# Patient Record
Sex: Male | Born: 1960 | Hispanic: No | Marital: Single | Smoking: Former smoker
Health system: Southern US, Community
[De-identification: ages and names within clinical notes are randomized; demographics above are authoritative.]

## PROBLEM LIST (undated history)

## (undated) DIAGNOSIS — I10 Essential (primary) hypertension: Secondary | ICD-10-CM

## (undated) DIAGNOSIS — E785 Hyperlipidemia, unspecified: Secondary | ICD-10-CM

## (undated) DIAGNOSIS — H348112 Central retinal vein occlusion, right eye, stable: Secondary | ICD-10-CM

## (undated) DIAGNOSIS — N529 Male erectile dysfunction, unspecified: Secondary | ICD-10-CM

## (undated) HISTORY — DX: Essential (primary) hypertension: I10

## (undated) HISTORY — DX: Central retinal vein occlusion, right eye, stable: H34.8112

## (undated) HISTORY — DX: Hyperlipidemia, unspecified: E78.5

## (undated) HISTORY — PX: TONSILLECTOMY: SHX5217

## (undated) HISTORY — DX: Male erectile dysfunction, unspecified: N52.9

---

## 2001-04-22 ENCOUNTER — Encounter: Admission: RE | Admit: 2001-04-22 | Discharge: 2001-04-22 | Payer: Self-pay | Admitting: Family Medicine

## 2001-04-22 ENCOUNTER — Encounter: Payer: Self-pay | Admitting: Family Medicine

## 2012-03-12 ENCOUNTER — Encounter: Payer: Self-pay | Admitting: Family Medicine

## 2012-03-12 ENCOUNTER — Ambulatory Visit (INDEPENDENT_AMBULATORY_CARE_PROVIDER_SITE_OTHER): Payer: 59 | Admitting: Family Medicine

## 2012-03-12 VITALS — BP 128/98 | HR 92 | Temp 98.2°F | Resp 16 | Ht 65.25 in | Wt 205.0 lb

## 2012-03-12 DIAGNOSIS — R0789 Other chest pain: Secondary | ICD-10-CM

## 2012-03-12 DIAGNOSIS — R5383 Other fatigue: Secondary | ICD-10-CM

## 2012-03-12 DIAGNOSIS — E669 Obesity, unspecified: Secondary | ICD-10-CM

## 2012-03-12 DIAGNOSIS — N400 Enlarged prostate without lower urinary tract symptoms: Secondary | ICD-10-CM

## 2012-03-12 DIAGNOSIS — Z0000 Encounter for general adult medical examination without abnormal findings: Secondary | ICD-10-CM

## 2012-03-12 DIAGNOSIS — Z13220 Encounter for screening for lipoid disorders: Secondary | ICD-10-CM

## 2012-03-12 DIAGNOSIS — R072 Precordial pain: Secondary | ICD-10-CM

## 2012-03-12 DIAGNOSIS — Z23 Encounter for immunization: Secondary | ICD-10-CM

## 2012-03-12 LAB — CBC WITH DIFFERENTIAL/PLATELET
Basophils Absolute: 0 10*3/uL (ref 0.0–0.1)
Basophils Relative: 0 % (ref 0–1)
Eosinophils Absolute: 0.2 10*3/uL (ref 0.0–0.7)
Eosinophils Relative: 2 % (ref 0–5)
HCT: 42.2 % (ref 39.0–52.0)
Hemoglobin: 14.9 g/dL (ref 13.0–17.0)
Lymphocytes Relative: 21 % (ref 12–46)
Lymphs Abs: 1.6 10*3/uL (ref 0.7–4.0)
MCH: 29.6 pg (ref 26.0–34.0)
MCHC: 35.3 g/dL (ref 30.0–36.0)
MCV: 83.9 fL (ref 78.0–100.0)
Monocytes Absolute: 0.6 10*3/uL (ref 0.1–1.0)
Monocytes Relative: 8 % (ref 3–12)
Neutro Abs: 4.9 10*3/uL (ref 1.7–7.7)
Neutrophils Relative %: 69 % (ref 43–77)
Platelets: 328 10*3/uL (ref 150–400)
RBC: 5.03 MIL/uL (ref 4.22–5.81)
RDW: 13.6 % (ref 11.5–15.5)
WBC: 7.2 10*3/uL (ref 4.0–10.5)

## 2012-03-12 LAB — COMPREHENSIVE METABOLIC PANEL
ALT: 24 U/L (ref 0–53)
AST: 21 U/L (ref 0–37)
Albumin: 5 g/dL (ref 3.5–5.2)
Alkaline Phosphatase: 63 U/L (ref 39–117)
BUN: 13 mg/dL (ref 6–23)
CO2: 26 mEq/L (ref 19–32)
Calcium: 10 mg/dL (ref 8.4–10.5)
Chloride: 101 mEq/L (ref 96–112)
Creat: 1.05 mg/dL (ref 0.50–1.35)
Glucose, Bld: 95 mg/dL (ref 70–99)
Potassium: 4.3 mEq/L (ref 3.5–5.3)
Sodium: 136 mEq/L (ref 135–145)
Total Bilirubin: 0.4 mg/dL (ref 0.3–1.2)
Total Protein: 7.5 g/dL (ref 6.0–8.3)

## 2012-03-12 LAB — LIPID PANEL
Cholesterol: 278 mg/dL — ABNORMAL HIGH (ref 0–200)
HDL: 41 mg/dL (ref >39)
LDL Cholesterol: 221 mg/dL — ABNORMAL HIGH (ref 0–99)
Total CHOL/HDL Ratio: 6.8 Ratio
Triglycerides: 78 mg/dL (ref <150)
VLDL: 16 mg/dL (ref 0–40)

## 2012-03-12 LAB — POCT URINALYSIS DIPSTICK
Bilirubin, UA: NEGATIVE
Glucose, UA: NEGATIVE
Ketones, UA: NEGATIVE
Leukocytes, UA: NEGATIVE
Nitrite, UA: NEGATIVE
Protein, UA: NEGATIVE
Spec Grav, UA: 1.01
Urobilinogen, UA: 0.2
pH, UA: 7

## 2012-03-12 LAB — IFOBT (OCCULT BLOOD): IFOBT: NEGATIVE

## 2012-03-12 NOTE — Patient Instructions (Addendum)
Keeping you healthy  Get these tests  Blood pressure- Have your blood pressure checked once a year by your healthcare provider.  Normal blood pressure is 120/80  Weight- Have your body mass index (BMI) calculated to screen for obesity.  BMI is a measure of body fat based on height and weight. You can also calculate your own BMI at [email or web address].  Cholesterol- Have your cholesterol checked every year.  Diabetes- Have your blood sugar checked regularly if you have high blood pressure, high cholesterol, have a family history of diabetes or if you are overweight.  Screening for Colon Cancer- Colonoscopy starting at age 34.  Screening may begin sooner depending on your family history and other health conditions. Follow up colonoscopy as directed by your Gastroenterologist.  Screening for Prostate Cancer- Both blood work (PSA) and a rectal exam help screen for Prostate Cancer.  Screening begins at age 35 with African-American men and at age 50 with Caucasian men.  Screening may begin sooner depending on your family history.  Take these medicines  Aspirin- One aspirin daily can help prevent Heart disease and Stroke.  Flu shot- Every fall.  Tetanus- Every 10 years. Tdap was given today. Next TD due in 10 years.  Zostavax- Once after the age of 36 to prevent Shingles. We are recommending that you get this vaccine after age 16.  Pneumonia shot- Once after the age of 68; if you are younger than 87, ask your healthcare provider if you need a Pneumonia shot.  Take these steps  Don't smoke- If you do smoke, talk to your doctor about quitting.  For tips on how to quit, go to www.smokefree.gov or call 1-800-QUIT-NOW.  Be physically active- Exercise 5 days a week for at least 30 minutes.  If you are not already physically active start slow and gradually work up to 30 minutes of moderate physical activity.  Examples of moderate activity include walking briskly, mowing the yard, dancing,  swimming, bicycling, etc.  Eat a healthy diet- Eat a variety of healthy food such as fruits, vegetables, low fat milk, low fat cheese, yogurt, lean meant, poultry, fish, beans, tofu, etc. For more information go to www.thenutritionsource.org  Drink alcohol in moderation- Limit alcohol intake to less than two drinks a day. Never drink and drive.  Dentist- Brush and floss twice daily; visit your dentist twice a year.  Depression- Your emotional health is as important as your physical health. If you're feeling down, or losing interest in things you would normally enjoy please talk to your healthcare provider.  Eye exam- Visit your eye doctor every year.  Safe sex- If you may be exposed to a sexually transmitted infection, use a condom.  Seat belts- Seat belts can save your life; always wear one.  Smoke/Carbon Monoxide detectors- These detectors need to be installed on the appropriate level of your home.  Replace batteries at least once a year.  Skin cancer- When out in the sun, cover up and use sunscreen 15 SPF or higher.  Violence- If anyone is threatening you, please tell your healthcare provider.  Living Will/ Health care power of attorney- Speak with your healthcare provider and family.     Benign Prostatic Hypertrophy  The prostate gland is part of the reproductive system of men. A normal prostate is about the size and shape of a walnut. The prostate gland makes a fluid that is mixed with sperm to make semen. This gland surrounds the urethra and is located in front of the rectum  and just below the bladder. The bladder is where urine is stored. The urethra is the tube through which urine passes from the bladder to get out of the body. The prostate grows as a man ages. An enlarged prostate not caused by cancer is called benign prostatic hypertrophy (BPH). This is a common health problem in men over age 50. This condition is a normal part of aging. An enlarged prostate presses on the  urethra. This makes it harder to pass urine. In the early stages of enlargement, the bladder can get by with a narrowed urethra by forcing the urine through. If the problem gets worse, medical or surgical treatment may be required.  This condition should be followed by your caregiver. Longstanding back pressure on the kidneys can cause infection. Back pressure and infection can progress to bladder damage and kidney (renal) failure. If needed, your caregiver may refer you to a specialist in kidney and prostate disease (urologist). CAUSES  The exact cause is not known.  SYMPTOMS   You are not able to completely empty your bladder.   Getting up often during the night to urinate.   Need to urinate frequently during the day.   Difficultly in starting urine flow.   Decrease in size and strength of the urine stream.   Dribbling after urination.   Pain on urination (more common with infection).   Inability to pass your water. This needs immediate treatment.  DIAGNOSIS  These tests will help your caregiver understand your problem:  Digital rectal exam (DRE). In a rectal exam, your caregiver checks your prostate by putting a gloved, lubricated finger into the rectum to feel the back of your prostate gland. This exam detects the size of the gland and abnormal lumps or growths.   Urinalysis (exam of the urine). This may include a culture if there is concern about infection.   Prostate Specific Antigen (PSA). This is a blood test used to screen for prostate cancer. It is not used alone for diagnosing prostate cancer.   Rectal ultrasound (sonogram). This test uses sound waves to electronically produce a "picture" of the prostate. It helps examine the prostate gland for cancer.  TREATMENT  Mild symptoms may not need treatment. Simple observation and yearly exams may be all that is required. Medications and surgery are options for more severe problems. Your caregiver can help you make an informed  decision for what is best. Two classes of medications are available for relief of prostate symptoms:  Medications that shrink the prostate. This helps relieve symptoms.   Uncommon side effects include problems with sexual function.   Medications to relax the muscle of the prostate. This also relieves the obstruction.   Side effects can include dizziness, fatigue, lightheadedness, and retrograde ejaculation (diminished volume of ejaculate).  Several types of surgical treatments are available for relief of prostate symptoms:  Transurethral resection of the prostate (TURP). In this treatment, an instrument is inserted through opening at the tip of the penis. It is used to cut away pieces of the inner core of the prostate. The pieces are removed through the same opening of the penis. This removes the obstruction and helps get rid of the symptoms.   Transurethral incision (TUIP). In this procedure, small cuts are made in the prostate. This lessens the prostates pressure on the urethra.   Transurethral microwave thermotherapy (TUMT). This procedure uses microwaves to create heat. The heat destroys and removes a small amount of prostate tissue.   Transurethral needle ablation (  TUNA). This is a procedure that uses radio frequencies to do the same as TUMT.   Interstitial laser coagulation (ILC). This is a procedure that uses a laser to do the same as TUMT and TUNA.   Transurethral electrovaporization (TUVP). This is a procedure that uses electrodes to do the same as the procedures listed above.  Regardless of the method of treatment chosen, you and your caregiver will discuss the options. With this knowledge, you along with your caregiver can decide upon the best treatment for you. SEEK MEDICAL CARE IF:   You develop chills, fever of 100.5 F (38.1 C), or night sweats.   There is unexplained back pain.   Symptoms are not helped by medications prescribed.   You develop medication side effects.    Your urine becomes very dark or has a bad smell.  SEEK IMMEDIATE MEDICAL CARE IF:   You are suddenly unable to urinate. This is an emergency. You should be seen immediately.   There are large amounts of blood or clots in the urine.   Your urinary problems become unmanageable.   You develop lightheadedness, severe dizziness, or you feel faint.   You develop moderate to severe low back or flank pain.   You develop chills or fever.  Document Released: 08/04/2005 Document Revised: 07/24/2011 Document Reviewed: 04/26/2007 [facility] Patient Information 2012 [place], [place].  Today, your prostate felt enlarged and there may have been a palpable nodule on the right lobe. I may be referring you to a Urologist for further evaluation, especially if your PSA (prostate blood test) is elevated.

## 2012-03-12 NOTE — Progress Notes (Signed)
Subjective:    Patient ID: Jorge Molina, male    DOB: [birth year], 51 y.o.   MRN: [id number]  HPI This 51 y.o. Cauc male is here for CPE (no exam in ~5 years). Acknowledges obesity and weight  loss goal is 190 lbs by next birthday. He is advised about CRS starting at age 65; requests  referral to GI in [place], [place].   Pt is a single male who is employed; he quit smoking ~ 10 years ago, consumes 1-2 alcoholic  drinks daily. Does not engage in daily exercise; he does plan to lose weight and his goal is 190 lbs by  08/18/2010 (his birthday).  He is sexually active and has had 3 partners in last 12 months (uses condoms).    Review of Systems  Constitutional: Positive for fatigue. Negative for fever, diaphoresis, activity change, appetite change and unexpected weight change.  HENT: Negative.   Eyes:       Wears "readers"  Respiratory: Positive for chest tightness. Negative for cough, choking and shortness of breath.        Has symptoms when at work and having a stressful day; No radiation, SOB or diaphoresis  Cardiovascular: Negative.   Gastrointestinal: Positive for blood in stool. Negative for abdominal pain, diarrhea, constipation and rectal pain.       Chronic infrequent occurrence; no pain or hx of hemorrhoids  Genitourinary: Positive for frequency. Negative for dysuria, urgency, hematuria and difficulty urinating.       Nocturia 1-2x  Musculoskeletal: Negative.   Skin: Negative.   Neurological: Negative.   Hematological: Negative.   Psychiatric/Behavioral: Negative.        Objective:   Physical Exam  Nursing note and vitals reviewed. Constitutional: He is oriented to person, place, and time. He appears well-developed and well-nourished. No distress.  HENT:  Head: Normocephalic and atraumatic.  Right Ear: Hearing, tympanic membrane, external ear and ear canal normal.  Left Ear: Hearing, tympanic membrane, external ear and ear canal normal.  Nose: Nose normal. No mucosal  edema, nasal deformity or septal deviation.  Mouth/Throat: Uvula is midline, oropharynx is clear and moist and mucous membranes are normal. No oral lesions. Normal dentition. No posterior oropharyngeal erythema.  Eyes: Conjunctivae and EOM are normal. Pupils are equal, round, and reactive to light. No scleral icterus.  Neck: Normal range of motion. Neck supple. No thyromegaly present.  Cardiovascular: Normal rate, regular rhythm, normal heart sounds and intact distal pulses.  Exam reveals no gallop and no friction rub.   No murmur heard. Pulmonary/Chest: Effort normal and breath sounds normal. No respiratory distress. He has no wheezes. He has no rales.  Abdominal: Soft. Normal aorta and bowel sounds are normal. He exhibits no distension, no pulsatile midline mass and no mass. There is no hepatosplenomegaly. There is no tenderness. There is no guarding and no CVA tenderness. No hernia.  Genitourinary: Rectum normal, testes normal and penis normal. Rectal exam shows no external hemorrhoid, no fissure, no mass, no tenderness and anal tone normal. Guaiac negative stool. Prostate is enlarged. Prostate is not tender. Right testis shows no mass, no swelling and no tenderness. Right testis is descended. Left testis shows no mass, no swelling and no tenderness. Left testis is descended.  Musculoskeletal: Normal range of motion. He exhibits no edema and no tenderness.  Lymphadenopathy:    He has no cervical adenopathy.       Right: No inguinal adenopathy present.       Left: No inguinal adenopathy present.  Neurological: He is alert and oriented to person, place, and time. He has normal reflexes. No cranial nerve deficit. He exhibits normal muscle tone. Coordination normal.  Skin: Skin is warm and dry. No rash noted. No erythema.  Psychiatric: He has a normal mood and affect. His behavior is normal. Judgment and thought content normal.     ECG: NSR; no ectopy or ST-TW changes     Assessment & Plan:    1. Routine general medical examination at a health care facility  PSA, IFOBT POC (occult bld, rslt in office), POCT urinalysis dipstick  2. Fatigue  Comprehensive metabolic panel, Vitamin D, 25-hydroxy, CBC with Differential, Testosterone  3. Chest pain, mid sternal  EKG 12-Lead  4. BPH (benign prostatic hypertrophy)    5. Obesity (BMI 35.0-39.9 without comorbidity)  Lipid panel, Vitamin D, 25-hydroxy  6. Screening for hyperlipidemia  Lipid panel  7. Need for prophylactic vaccination with combined diphtheria-tetanus-pertussis (DTP) vaccine  Tdap vaccine greater than or equal to 7yo IM

## 2012-03-13 LAB — VITAMIN D 25 HYDROXY (VIT D DEFICIENCY, FRACTURES): Vit D, 25-Hydroxy: 50 ng/mL (ref 30–89)

## 2012-03-13 LAB — PSA: PSA: 0.68 ng/mL (ref <=4.00)

## 2012-03-16 DIAGNOSIS — E669 Obesity, unspecified: Secondary | ICD-10-CM

## 2012-03-16 DIAGNOSIS — R5383 Other fatigue: Secondary | ICD-10-CM | POA: Insufficient documentation

## 2012-03-16 DIAGNOSIS — N400 Enlarged prostate without lower urinary tract symptoms: Secondary | ICD-10-CM

## 2012-03-16 HISTORY — DX: Benign prostatic hyperplasia without lower urinary tract symptoms: N40.0

## 2012-03-16 HISTORY — DX: Other fatigue: R53.83

## 2012-03-16 HISTORY — DX: Obesity, unspecified: E66.9

## 2012-03-16 NOTE — Progress Notes (Signed)
Quick Note:  Please call pt and advise that the following labs are abnormal... All your labs are normal except Total cholesterol and LDL ("bad") cholesterol. I would like for you to get OTC Fish Oil supplement ( like Schiff MegaRed Omega-3 Krill Oil) and take 1 gelcap every day. Also, try to improve nutrition and avoid [place] foods, junk foods, processed foods; eat more fruits and vegetables and get regular exercise. You have set a weight goal of 190-195 lbs by your next birthday. It would be a good idea to recheck Lipids about that time to see if weight loss has made a difference. The high LDL suggests that this is inherited; if so, you may have to go on medication but we ill address that after the next lab visit. Make an appt to come back in January 2014.  Copy to pt. ______

## 2013-03-18 DIAGNOSIS — H348112 Central retinal vein occlusion, right eye, stable: Secondary | ICD-10-CM

## 2013-03-18 HISTORY — DX: Central retinal vein occlusion, right eye, stable: H34.8112

## 2013-03-30 ENCOUNTER — Encounter (INDEPENDENT_AMBULATORY_CARE_PROVIDER_SITE_OTHER): Payer: 59 | Admitting: Ophthalmology

## 2013-03-30 DIAGNOSIS — H3530 Unspecified macular degeneration: Secondary | ICD-10-CM

## 2013-03-30 DIAGNOSIS — H35039 Hypertensive retinopathy, unspecified eye: Secondary | ICD-10-CM

## 2013-03-30 DIAGNOSIS — H348192 Central retinal vein occlusion, unspecified eye, stable: Secondary | ICD-10-CM

## 2013-03-30 DIAGNOSIS — I10 Essential (primary) hypertension: Secondary | ICD-10-CM

## 2013-03-30 DIAGNOSIS — H43819 Vitreous degeneration, unspecified eye: Secondary | ICD-10-CM

## 2013-04-25 ENCOUNTER — Encounter (INDEPENDENT_AMBULATORY_CARE_PROVIDER_SITE_OTHER): Payer: 59 | Admitting: Ophthalmology

## 2013-04-25 DIAGNOSIS — H348192 Central retinal vein occlusion, unspecified eye, stable: Secondary | ICD-10-CM

## 2013-04-25 DIAGNOSIS — H43819 Vitreous degeneration, unspecified eye: Secondary | ICD-10-CM

## 2013-04-25 DIAGNOSIS — H2510 Age-related nuclear cataract, unspecified eye: Secondary | ICD-10-CM

## 2013-05-20 ENCOUNTER — Encounter (INDEPENDENT_AMBULATORY_CARE_PROVIDER_SITE_OTHER): Payer: 59 | Admitting: Ophthalmology

## 2013-05-20 DIAGNOSIS — H348192 Central retinal vein occlusion, unspecified eye, stable: Secondary | ICD-10-CM

## 2013-05-20 DIAGNOSIS — H43819 Vitreous degeneration, unspecified eye: Secondary | ICD-10-CM

## 2013-05-20 DIAGNOSIS — H2510 Age-related nuclear cataract, unspecified eye: Secondary | ICD-10-CM

## 2013-05-23 ENCOUNTER — Encounter (INDEPENDENT_AMBULATORY_CARE_PROVIDER_SITE_OTHER): Payer: 59 | Admitting: Ophthalmology

## 2013-06-14 ENCOUNTER — Encounter (INDEPENDENT_AMBULATORY_CARE_PROVIDER_SITE_OTHER): Payer: 59 | Admitting: Ophthalmology

## 2013-06-14 DIAGNOSIS — H348192 Central retinal vein occlusion, unspecified eye, stable: Secondary | ICD-10-CM

## 2013-06-14 DIAGNOSIS — H2510 Age-related nuclear cataract, unspecified eye: Secondary | ICD-10-CM

## 2013-06-14 DIAGNOSIS — H43819 Vitreous degeneration, unspecified eye: Secondary | ICD-10-CM

## 2013-07-12 ENCOUNTER — Encounter (INDEPENDENT_AMBULATORY_CARE_PROVIDER_SITE_OTHER): Payer: 59 | Admitting: Ophthalmology

## 2013-07-12 DIAGNOSIS — H348192 Central retinal vein occlusion, unspecified eye, stable: Secondary | ICD-10-CM

## 2013-07-12 DIAGNOSIS — H35039 Hypertensive retinopathy, unspecified eye: Secondary | ICD-10-CM

## 2013-07-12 DIAGNOSIS — I10 Essential (primary) hypertension: Secondary | ICD-10-CM

## 2013-07-12 DIAGNOSIS — H43819 Vitreous degeneration, unspecified eye: Secondary | ICD-10-CM

## 2013-08-04 ENCOUNTER — Ambulatory Visit (INDEPENDENT_AMBULATORY_CARE_PROVIDER_SITE_OTHER): Payer: 59 | Admitting: Family Medicine

## 2013-08-04 VITALS — BP 142/96 | HR 80 | Temp 98.5°F | Resp 18 | Ht 66.0 in | Wt 209.0 lb

## 2013-08-04 DIAGNOSIS — H348192 Central retinal vein occlusion, unspecified eye, stable: Secondary | ICD-10-CM

## 2013-08-04 DIAGNOSIS — H348112 Central retinal vein occlusion, right eye, stable: Secondary | ICD-10-CM

## 2013-08-04 DIAGNOSIS — I10 Essential (primary) hypertension: Secondary | ICD-10-CM

## 2013-08-04 DIAGNOSIS — E7800 Pure hypercholesterolemia, unspecified: Secondary | ICD-10-CM

## 2013-08-04 HISTORY — DX: Pure hypercholesterolemia, unspecified: E78.00

## 2013-08-04 HISTORY — DX: Essential (primary) hypertension: I10

## 2013-08-04 LAB — COMPREHENSIVE METABOLIC PANEL
AST: 18 U/L (ref 0–37)
Albumin: 4.8 g/dL (ref 3.5–5.2)
Alkaline Phosphatase: 65 U/L (ref 39–117)
Potassium: 4.8 mEq/L (ref 3.5–5.3)
Sodium: 139 mEq/L (ref 135–145)
Total Protein: 7.3 g/dL (ref 6.0–8.3)

## 2013-08-04 LAB — POCT CBC
Hemoglobin: 13.8 g/dL — AB (ref 14.1–18.1)
MCH, POC: 29.1 pg (ref 27–31.2)
MPV: 9.4 fL (ref 0–99.8)
POC Granulocyte: 4.8 (ref 2–6.9)
POC MID %: 6 %M (ref 0–12)
RBC: 4.75 M/uL (ref 4.69–6.13)
WBC: 7.1 10*3/uL (ref 4.6–10.2)

## 2013-08-04 LAB — POCT URINALYSIS DIPSTICK
Glucose, UA: NEGATIVE
Nitrite, UA: NEGATIVE
Urobilinogen, UA: 0.2

## 2013-08-04 LAB — POCT GLYCOSYLATED HEMOGLOBIN (HGB A1C): Hemoglobin A1C: 5.1

## 2013-08-04 LAB — LIPID PANEL
LDL Cholesterol: 179 mg/dL — ABNORMAL HIGH (ref 0–99)
VLDL: 24 mg/dL (ref 0–40)

## 2013-08-04 MED ORDER — LISINOPRIL 5 MG PO TABS: 5.0000 mg | ORAL_TABLET | Freq: Every day | ORAL | Status: DC

## 2013-08-04 NOTE — Progress Notes (Signed)
Subjective:    Patient ID: Jorge Molina., male    DOB: 01/07/1961, 52 y.o.   MRN: [id number]  Hypertension Associated symptoms include palpitations. Pertinent negatives include no chest pain, headaches or shortness of breath.   This 52 y.o. male presents for evaluation of elevated blood pressure.    S/p central retinal vein occlusion in 03/2013; receiving monthly injections. Referred to Dr. Link Snuffer and awaiting labs. Blood pressure at ophthalmology visits 130-150/80-90s. Felt heart fluttering.  Didn't feel quite normal today.  No chest pain, SOB, diaphoresis.   Wrist cuff today 174/121 at work.   No headache, dizziness, vision changes; no chest pian, SOB, diaphoresis.  No leg swelling.     Review of Systems  Constitutional: Negative for fever, chills, diaphoresis and fatigue.  Respiratory: Negative for cough, shortness of breath, wheezing and stridor.   Cardiovascular: Positive for palpitations. Negative for chest pain and leg swelling.  Gastrointestinal: Negative for nausea, vomiting and abdominal pain.  Endocrine: Negative for cold intolerance, heat intolerance, polydipsia, polyphagia and polyuria.  Neurological: Negative for dizziness, tremors, seizures, syncope, facial asymmetry, speech difficulty, weakness, light-headedness, numbness and headaches.   Past Medical History  Diagnosis Date  . Central retinal vein occlusion of right eye 03/18/2013    [name]   Past Surgical History  Procedure Laterality Date  . Tonsillectomy     No Known Allergies Current Outpatient Prescriptions on File Prior to Visit  Medication Sig Dispense Refill  . Multiple Vitamin (MULTIVITAMIN) tablet Take 1 tablet by mouth daily.       No current facility-administered medications on file prior to visit.   History   Social History  . Marital Status: Single    Spouse Name: N/A    Number of Children: N/A  . Years of Education: N/A   Occupational History  . Not on file.   Social History  Main Topics  . Smoking status: Former Smoker -- 20 years    Types: Cigars    Quit date: 08/18/2000  . Smokeless tobacco: Not on file     Comment: smoked on/off  . Alcohol Use: Yes     Comment: beer and wine  . Drug Use: Not on file  . Sexual Activity: Not on file   Other Topics Concern  . Not on file   Social History Narrative   Marital status: single      Children: none      Employment:  Insurance account manager       Tobacco: none       Alcohol:  1-2 beers and wine per day.      Drugs: none       Exercise: none   Family History  Problem Relation Age of Onset  . Hypertension Mother   . Heart disease Father 72    CABG  . Stroke Father     multiple TIAs  . Mental illness Father   . Hypertension Paternal Uncle   . Stroke Paternal Uncle   . Heart disease Maternal Grandmother   . Cancer Paternal Grandmother   . Cancer Paternal Grandfather   . Asthma Sister        Objective:   Physical Exam  Nursing note and vitals reviewed. Constitutional: He is oriented to person, place, and time. He appears well-developed and well-nourished. No distress.  HENT:  Head: Normocephalic and atraumatic.  Right Ear: External ear normal.  Left Ear: External ear normal.  Eyes: Conjunctivae and EOM are normal. Pupils are equal,  round, and reactive to light.  Neck: Normal range of motion. Neck supple. Carotid bruit is not present. No thyromegaly present.  Cardiovascular: Normal rate, regular rhythm, normal heart sounds and intact distal pulses.  Exam reveals no gallop and no friction rub.   No murmur heard. Pulmonary/Chest: Effort normal and breath sounds normal. He has no wheezes. He has no rales.  Abdominal: Soft. Bowel sounds are normal. There is no tenderness. There is no rebound and no guarding.  Lymphadenopathy:    He has no cervical adenopathy.  Neurological: He is alert and oriented to person, place, and time. No cranial nerve deficit. He exhibits normal muscle  tone. Coordination normal.  Skin: Skin is warm and dry. No rash noted. He is not diaphoretic.  Psychiatric: He has a normal mood and affect. His behavior is normal.   Results for orders placed in visit on 08/04/13  POCT CBC      Result Value Range   WBC 7.1  4.6 - 10.2 K/uL   Lymph, poc 1.9  0.6 - 3.4   POC LYMPH PERCENT 26.8  10 - 50 %L   MID (cbc) 0.4  0 - 0.9   POC MID % 6.0  0 - 12 %M   POC Granulocyte 4.8  2 - 6.9   Granulocyte percent 67.2  37 - 80 %G   RBC 4.75  4.69 - 6.13 M/uL   Hemoglobin 13.8 (*) 14.1 - 18.1 g/dL   HCT, POC 16.1  09.6 - 53.7 %   MCV 93.6  80 - 97 fL   MCH, POC 29.1  27 - 31.2 pg   MCHC 31.0 (*) 31.8 - 35.4 g/dL   RDW, POC 04.5     Platelet Count, POC 284  142 - 424 K/uL   MPV 9.4  0 - 99.8 fL  POCT GLYCOSYLATED HEMOGLOBIN (HGB A1C)      Result Value Range   Hemoglobin A1C 5.1    POCT URINALYSIS DIPSTICK      Result Value Range   Color, UA yellow     Clarity, UA clear     Glucose, UA neg     Bilirubin, UA neg     Ketones, UA neg     Spec Grav, UA 1.015     Blood, UA tr-intact     pH, UA 6.0     Protein, UA neg     Urobilinogen, UA 0.2     Nitrite, UA neg     Leukocytes, UA Negative     EKG: NSR; no acute changes.    Assessment & Plan:  Essential hypertension, benign - Plan: EKG 12-Lead, POCT CBC, POCT glycosylated hemoglobin (Hb A1C), POCT urinalysis dipstick, Comprehensive metabolic panel, TSH, Lipid panel  Pure hypercholesterolemia - Plan: EKG 12-Lead, POCT CBC, POCT glycosylated hemoglobin (Hb A1C), POCT urinalysis dipstick, Comprehensive metabolic panel, TSH, Lipid panel  Central retinal vein occlusion of right eye  1. HTN: new onset; obtain labs, u/a, EKG.  Rx for Lisinopril 5mg  daily provided.  RTC six weeks for follow-up; recommend weight loss, exercise, low-sodium diet. 2.  Hypercholesterolemia: persistent; repeat labs today.  Recommend diet, exercise, low-cholesterol food choices. 3.  Central retinal vein occlusion R eye:  New.   Undergoing treatment; s/p rheumatology consultation; obtain FLP, HgbA1c.  Meds ordered this encounter  Medications  . lisinopril (PRINIVIL,ZESTRIL) 5 MG tablet    Sig: Take 1 tablet (5 mg total) by mouth daily.    Dispense:  30 tablet    Refill:  5    

## 2013-08-09 ENCOUNTER — Encounter (INDEPENDENT_AMBULATORY_CARE_PROVIDER_SITE_OTHER): Payer: 59 | Admitting: Ophthalmology

## 2013-08-09 DIAGNOSIS — H43819 Vitreous degeneration, unspecified eye: Secondary | ICD-10-CM

## 2013-08-09 DIAGNOSIS — H348192 Central retinal vein occlusion, unspecified eye, stable: Secondary | ICD-10-CM

## 2013-08-09 DIAGNOSIS — H35039 Hypertensive retinopathy, unspecified eye: Secondary | ICD-10-CM

## 2013-08-09 DIAGNOSIS — I10 Essential (primary) hypertension: Secondary | ICD-10-CM

## 2013-08-09 DIAGNOSIS — H2510 Age-related nuclear cataract, unspecified eye: Secondary | ICD-10-CM

## 2013-08-19 MED ORDER — ATORVASTATIN CALCIUM 10 MG PO TABS: 10.0000 mg | ORAL_TABLET | Freq: Every day | ORAL | Status: DC

## 2013-08-19 NOTE — Addendum Note (Signed)
Addended by: Ethelda ChickSMITH, Curley Hogen M on: 08/19/2013 10:19 AM   Modules accepted: Orders

## 2013-09-07 ENCOUNTER — Encounter (INDEPENDENT_AMBULATORY_CARE_PROVIDER_SITE_OTHER): Payer: 59 | Admitting: Ophthalmology

## 2013-09-07 DIAGNOSIS — H348192 Central retinal vein occlusion, unspecified eye, stable: Secondary | ICD-10-CM

## 2013-09-07 DIAGNOSIS — H43819 Vitreous degeneration, unspecified eye: Secondary | ICD-10-CM

## 2013-09-07 DIAGNOSIS — H35039 Hypertensive retinopathy, unspecified eye: Secondary | ICD-10-CM

## 2013-09-07 DIAGNOSIS — H2510 Age-related nuclear cataract, unspecified eye: Secondary | ICD-10-CM

## 2013-09-07 DIAGNOSIS — I10 Essential (primary) hypertension: Secondary | ICD-10-CM

## 2013-09-19 ENCOUNTER — Ambulatory Visit (INDEPENDENT_AMBULATORY_CARE_PROVIDER_SITE_OTHER): Payer: 59 | Admitting: Family Medicine

## 2013-09-19 ENCOUNTER — Encounter: Payer: Self-pay | Admitting: Family Medicine

## 2013-09-19 VITALS — BP 130/89 | HR 83 | Temp 98.4°F | Resp 16 | Ht 65.75 in | Wt 211.4 lb

## 2013-09-19 DIAGNOSIS — H348112 Central retinal vein occlusion, right eye, stable: Secondary | ICD-10-CM

## 2013-09-19 DIAGNOSIS — E7800 Pure hypercholesterolemia, unspecified: Secondary | ICD-10-CM

## 2013-09-19 DIAGNOSIS — E669 Obesity, unspecified: Secondary | ICD-10-CM

## 2013-09-19 DIAGNOSIS — Z1211 Encounter for screening for malignant neoplasm of colon: Secondary | ICD-10-CM

## 2013-09-19 DIAGNOSIS — I10 Essential (primary) hypertension: Secondary | ICD-10-CM

## 2013-09-19 DIAGNOSIS — Z0000 Encounter for general adult medical examination without abnormal findings: Secondary | ICD-10-CM

## 2013-09-19 DIAGNOSIS — Z125 Encounter for screening for malignant neoplasm of prostate: Secondary | ICD-10-CM

## 2013-09-19 DIAGNOSIS — N529 Male erectile dysfunction, unspecified: Secondary | ICD-10-CM

## 2013-09-19 LAB — CBC WITH DIFFERENTIAL/PLATELET
BASOS ABS: 0 10*3/uL (ref 0.0–0.1)
Basophils Relative: 0 % (ref 0–1)
EOS ABS: 0.1 10*3/uL (ref 0.0–0.7)
EOS PCT: 2 % (ref 0–5)
HEMATOCRIT: 42.7 % (ref 39.0–52.0)
Hemoglobin: 14.6 g/dL (ref 13.0–17.0)
LYMPHS PCT: 26 % (ref 12–46)
Lymphs Abs: 1.7 10*3/uL (ref 0.7–4.0)
MCH: 29.6 pg (ref 26.0–34.0)
MCHC: 34.2 g/dL (ref 30.0–36.0)
MCV: 86.6 fL (ref 78.0–100.0)
MONO ABS: 0.5 10*3/uL (ref 0.1–1.0)
Monocytes Relative: 8 % (ref 3–12)
Neutro Abs: 4.1 10*3/uL (ref 1.7–7.7)
Neutrophils Relative %: 64 % (ref 43–77)
PLATELETS: 281 10*3/uL (ref 150–400)
RBC: 4.93 MIL/uL (ref 4.22–5.81)
RDW: 12.9 % (ref 11.5–15.5)
WBC: 6.4 10*3/uL (ref 4.0–10.5)

## 2013-09-19 LAB — COMPLETE METABOLIC PANEL WITH GFR
ALT: 24 U/L (ref 0–53)
AST: 21 U/L (ref 0–37)
Albumin: 4.8 g/dL (ref 3.5–5.2)
Alkaline Phosphatase: 72 U/L (ref 39–117)
BUN: 17 mg/dL (ref 6–23)
CALCIUM: 9.8 mg/dL (ref 8.4–10.5)
CHLORIDE: 100 meq/L (ref 96–112)
CO2: 26 meq/L (ref 19–32)
Creat: 1.01 mg/dL (ref 0.50–1.35)
GFR, Est Non African American: 86 mL/min
Glucose, Bld: 94 mg/dL (ref 70–99)
POTASSIUM: 4.9 meq/L (ref 3.5–5.3)
Sodium: 137 mEq/L (ref 135–145)
Total Bilirubin: 0.4 mg/dL (ref 0.2–1.2)
Total Protein: 7.6 g/dL (ref 6.0–8.3)

## 2013-09-19 LAB — LIPID PANEL
Cholesterol: 209 mg/dL — ABNORMAL HIGH (ref 0–200)
HDL: 54 mg/dL (ref >39)
LDL CALC: 126 mg/dL — AB (ref 0–99)
Total CHOL/HDL Ratio: 3.9 Ratio
Triglycerides: 143 mg/dL (ref <150)
VLDL: 29 mg/dL (ref 0–40)

## 2013-09-19 LAB — IFOBT (OCCULT BLOOD): IFOBT: NEGATIVE

## 2013-09-19 LAB — PSA: PSA: 0.79 ng/mL (ref <=4.00)

## 2013-09-19 NOTE — Patient Instructions (Signed)
1.  Discuss issue with Dr. [name].   2. Recommend exercising 3-5 days per week. 3.  Make sure that your multitvitamin has aspirin 81mg  in it.

## 2013-09-19 NOTE — Progress Notes (Signed)
Subjective:    Patient ID: Jorge LasterJohn Ingraham Jr., male    DOB: 04/29/1961, 53 y.o.   MRN: [id number]  HPI This 53 y.o. male presents for Complete Physical Examination.  Last physical 03/12/2012.  [facility]. TDAP 03/12/12. Hepatitis B series 2005.   No flu vaccines.   Colonoscopy never.  But agreeable. Eye exam 03/2013; [facility].  [place].  No g/c. Dental exam [facility].  Every six months.  Retinal vein occlusion:  Dr. Link Snuffereveschwar performed extensive labs including HIV, hepatitis screening.  Follow-up with Dr. Ashley Molina in two weeks.    HTN: two month follow-up; taking Lisinopril 5mg  daily.  Erections are decreased intensity; decreased duration; this has been going on before starting Lisinopril.   Has BP cuff at home but not checking BP much.    ED: requesting the blue pill.  Has difficulties with erections 25% of time.  Hyperlipidemia:  Started Lipitor at last visit.  No side effects; fasting today.   Review of Systems  Constitutional: Negative.   HENT: Negative.   Eyes: Negative.   Respiratory: Negative.   Cardiovascular: Negative.   Gastrointestinal: Negative.   Endocrine: Negative.   Genitourinary: Negative.   Musculoskeletal: Negative.   Skin: Negative.   Allergic/Immunologic: Negative.   Neurological: Negative.   Hematological: Negative.   Psychiatric/Behavioral: Negative.    Past Medical History  Diagnosis Date  . Central retinal vein occlusion of right eye 03/18/2013    Jorge Molina  . Hypertension   . Hyperlipidemia    Past Surgical History  Procedure Laterality Date  . Tonsillectomy     No Known Allergies Current Outpatient Prescriptions on File Prior to Visit  Medication Sig Dispense Refill  . atorvastatin (LIPITOR) 10 MG tablet Take 1 tablet (10 mg total) by mouth daily.  30 tablet  5  . lisinopril (PRINIVIL,ZESTRIL) 5 MG tablet Take 1 tablet (5 mg total) by mouth daily.  30 tablet  5  . Multiple Vitamin (MULTIVITAMIN) tablet Take 1 tablet by mouth daily.       No  current facility-administered medications on file prior to visit.   History   Social History  . Marital Status: Single    Spouse Name: N/A    Number of Children: N/A  . Years of Education: N/A   Occupational History  . Not on file.   Social History Main Topics  . Smoking status: Former Smoker -- 20 years    Types: Cigars    Quit date: 08/18/2000  . Smokeless tobacco: Not on file     Comment: smoked on/off  . Alcohol Use: 6.0 oz/week    10 Glasses of wine per week     Comment: beer and wine  . Drug Use: No  . Sexual Activity: Yes   Other Topics Concern  . Not on file   Social History Narrative   Marital status: single; dating.      Children: none      Employment:  Insurance account managerafety and compliance manager with [employer]       Tobacco: none       Alcohol:  1-2 beers and wine per day.  No DWIs.      Drugs: none       Exercise: none      Seatbelt:  100%      Guns:  Loaded guns.        Sexual activity: sexually active; no STDs; last STD screening 07/2013.  Total sexual partners = 40s.    75% of time uses condoms.  Females.  Family History  Problem Relation Age of Onset  . Hypertension Mother   . Heart disease Father 55    CABG  . Stroke Father     multiple TIAs  . Mental illness Father   . Hyperlipidemia Father   . Hypertension Father   . Hypertension Paternal Uncle   . Stroke Paternal Uncle   . Heart disease Maternal Grandmother   . Hypertension Maternal Grandmother   . Stroke Maternal Grandmother   . Cancer Paternal Grandmother   . Cancer Paternal Grandfather   . Asthma Sister        Objective:   Physical Exam  Constitutional: He is oriented to person, place, and time. He appears well-developed and well-nourished. No distress.  HENT:  Head: Normocephalic and atraumatic.  Right Ear: External ear normal.  Left Ear: External ear normal.  Nose: Nose normal.  Mouth/Throat: Oropharynx is clear and moist.  Eyes: Conjunctivae and EOM are normal. Pupils are  equal, round, and reactive to light.  Neck: Normal range of motion. Neck supple. Carotid bruit is not present. No thyromegaly present.  Cardiovascular: Normal rate, regular rhythm, normal heart sounds and intact distal pulses.  Exam reveals no gallop and no friction rub.   No murmur heard. Pulmonary/Chest: Effort normal and breath sounds normal. He has no wheezes. He has no rales.  Abdominal: Soft. Bowel sounds are normal. He exhibits no distension and no mass. There is no tenderness. There is no rebound and no guarding. Hernia confirmed negative in the right inguinal area and confirmed negative in the left inguinal area.  Genitourinary: Rectum normal, prostate normal, testes normal and penis normal. Right testis shows no mass, no swelling and no tenderness. Left testis shows no mass, no swelling and no tenderness. Circumcised.  Musculoskeletal:       Right shoulder: Normal.       Left shoulder: Normal.       Cervical back: Normal.  Lymphadenopathy:    He has no cervical adenopathy.       Right: No inguinal adenopathy present.       Left: No inguinal adenopathy present.  Neurological: He is alert and oriented to person, place, and time. He has normal reflexes. No cranial nerve deficit. He exhibits normal muscle tone. Coordination normal.  Skin: Skin is warm and dry. No rash noted. He is not diaphoretic.  Psychiatric: He has a normal mood and affect. His behavior is normal. Judgment and thought content normal.       Assessment & Plan:  Routine general medical examination at a health care facility - Plan: CBC with Differential, COMPLETE METABOLIC PANEL WITH GFR, Lipid panel, PSA, IFOBT POC (occult bld, rslt in office)  Essential hypertension, benign - Plan: CBC with Differential, COMPLETE METABOLIC PANEL WITH GFR  Pure hypercholesterolemia - Plan: Lipid panel  Prostate cancer screening - Plan: PSA, IFOBT POC (occult bld, rslt in office)  Colon cancer screening - Plan: Ambulatory referral  to Gastroenterology  Central retinal vein occlusion of right eye  Obesity (BMI 35.0-39.9 without comorbidity)  Erectile dysfunction   1.  CPE: anticipatory guidance --- weight loss, exercise.  Immunizations reviewed and discussed; pt declined flu vaccine.  Refer for colonoscopy; hemosure obtained. Obtain labs.   2.  Prostate cancer screening: completed; s/p DRE; obtain PSA.  No family hx of prostate cancer. 3.  Colon cancer screening: counseled during visit; refer to GI for colonoscopy; hemosure obtained. No family hx of colon cancer. 4.  HTN: improved with addition of Lisinopril;  no changes in management today. 5.  Hypercholesterolemia: uncontrolled; tolerating Lipitor; obtain labs. 6.  Obesity: uncontrolled; recommend weight loss, exercise, low-fat diet. 7.  ED: New.  Recommend discussing use of Viagra with ophthalmology due to hx of retinal vein occlusion; likely not a candidate for Viagra. 8. Retinal vein occlusion R eye: stable; followed by ophthalmology closely.  S/p rheumatology consultation with negative work up thus far.  No orders of the defined types were placed in this encounter.   Nilda Simmer, M.D.  [facility] [address] [place], [place]  [address] [phone] phone [phone] fax

## 2013-09-21 DIAGNOSIS — N529 Male erectile dysfunction, unspecified: Secondary | ICD-10-CM

## 2013-09-21 HISTORY — DX: Male erectile dysfunction, unspecified: N52.9

## 2013-10-05 ENCOUNTER — Encounter (INDEPENDENT_AMBULATORY_CARE_PROVIDER_SITE_OTHER): Payer: 59 | Admitting: Ophthalmology

## 2013-10-05 DIAGNOSIS — H2510 Age-related nuclear cataract, unspecified eye: Secondary | ICD-10-CM

## 2013-10-05 DIAGNOSIS — H348192 Central retinal vein occlusion, unspecified eye, stable: Secondary | ICD-10-CM

## 2013-10-05 DIAGNOSIS — H35039 Hypertensive retinopathy, unspecified eye: Secondary | ICD-10-CM

## 2013-10-05 DIAGNOSIS — H43819 Vitreous degeneration, unspecified eye: Secondary | ICD-10-CM

## 2013-10-05 DIAGNOSIS — I10 Essential (primary) hypertension: Secondary | ICD-10-CM

## 2013-10-26 ENCOUNTER — Encounter: Payer: Self-pay | Admitting: Family Medicine

## 2013-11-02 ENCOUNTER — Encounter (INDEPENDENT_AMBULATORY_CARE_PROVIDER_SITE_OTHER): Payer: 59 | Admitting: Ophthalmology

## 2013-11-02 DIAGNOSIS — H348192 Central retinal vein occlusion, unspecified eye, stable: Secondary | ICD-10-CM

## 2013-11-02 DIAGNOSIS — H35039 Hypertensive retinopathy, unspecified eye: Secondary | ICD-10-CM

## 2013-11-02 DIAGNOSIS — H43819 Vitreous degeneration, unspecified eye: Secondary | ICD-10-CM

## 2013-11-02 DIAGNOSIS — I10 Essential (primary) hypertension: Secondary | ICD-10-CM

## 2013-11-15 ENCOUNTER — Telehealth: Payer: Self-pay

## 2013-11-15 NOTE — Telephone Encounter (Signed)
Patient is wanting to let Dr Katrinka BlazingSmith know that he spoke with his other other doctor and has been given the ok to get the medicine you and him talked about the last he saw Dr Katrinka BlazingSmith.   [facility]#: [phone]   Pharmacy#: [facility] [place]

## 2013-11-17 NOTE — Telephone Encounter (Signed)
Pt states that he was told he just needed to ask his ophthalmologist about taking this medication. He is going to have them fax over the last office visit but is sure that the discussion will not be in the notes on this visit. He states it was just a "passing conversation" that it was ok for him to take Viagra.

## 2013-11-17 NOTE — Telephone Encounter (Signed)
Call --- I will await consultation note/records from ophthalmology to confirm clearance to use Viagra.  I will let patient know when I receive the records; it can take several weeks to receive a note from a specialist.  Pt may want us to contact ophthalmology to fax us recent note.

## 2013-11-30 ENCOUNTER — Encounter (INDEPENDENT_AMBULATORY_CARE_PROVIDER_SITE_OTHER): Payer: 59 | Admitting: Ophthalmology

## 2013-11-30 DIAGNOSIS — H2510 Age-related nuclear cataract, unspecified eye: Secondary | ICD-10-CM

## 2013-11-30 DIAGNOSIS — H348192 Central retinal vein occlusion, unspecified eye, stable: Secondary | ICD-10-CM

## 2013-11-30 DIAGNOSIS — I10 Essential (primary) hypertension: Secondary | ICD-10-CM

## 2013-11-30 DIAGNOSIS — H35039 Hypertensive retinopathy, unspecified eye: Secondary | ICD-10-CM

## 2013-11-30 DIAGNOSIS — H43819 Vitreous degeneration, unspecified eye: Secondary | ICD-10-CM

## 2013-12-28 ENCOUNTER — Encounter (INDEPENDENT_AMBULATORY_CARE_PROVIDER_SITE_OTHER): Payer: 59 | Admitting: Ophthalmology

## 2013-12-28 DIAGNOSIS — H35039 Hypertensive retinopathy, unspecified eye: Secondary | ICD-10-CM

## 2013-12-28 DIAGNOSIS — I10 Essential (primary) hypertension: Secondary | ICD-10-CM

## 2013-12-28 DIAGNOSIS — H2510 Age-related nuclear cataract, unspecified eye: Secondary | ICD-10-CM

## 2013-12-28 DIAGNOSIS — H43819 Vitreous degeneration, unspecified eye: Secondary | ICD-10-CM

## 2013-12-28 DIAGNOSIS — H348192 Central retinal vein occlusion, unspecified eye, stable: Secondary | ICD-10-CM

## 2014-01-18 ENCOUNTER — Other Ambulatory Visit: Payer: Self-pay | Admitting: Family Medicine

## 2014-01-18 ENCOUNTER — Ambulatory Visit (INDEPENDENT_AMBULATORY_CARE_PROVIDER_SITE_OTHER): Payer: 59 | Admitting: Family Medicine

## 2014-01-18 ENCOUNTER — Ambulatory Visit: Payer: 59 | Admitting: Family Medicine

## 2014-01-18 ENCOUNTER — Encounter: Payer: Self-pay | Admitting: Family Medicine

## 2014-01-18 VITALS — BP 108/80 | HR 77 | Temp 98.9°F | Resp 16 | Ht 65.5 in | Wt 210.0 lb

## 2014-01-18 DIAGNOSIS — H348192 Central retinal vein occlusion, unspecified eye, stable: Secondary | ICD-10-CM

## 2014-01-18 DIAGNOSIS — E669 Obesity, unspecified: Secondary | ICD-10-CM

## 2014-01-18 DIAGNOSIS — N529 Male erectile dysfunction, unspecified: Secondary | ICD-10-CM

## 2014-01-18 DIAGNOSIS — H348112 Central retinal vein occlusion, right eye, stable: Secondary | ICD-10-CM

## 2014-01-18 DIAGNOSIS — I10 Essential (primary) hypertension: Secondary | ICD-10-CM

## 2014-01-18 DIAGNOSIS — E7800 Pure hypercholesterolemia, unspecified: Secondary | ICD-10-CM

## 2014-01-18 LAB — COMPREHENSIVE METABOLIC PANEL
ALT: 25 U/L (ref 0–53)
AST: 17 U/L (ref 0–37)
Albumin: 4.4 g/dL (ref 3.5–5.2)
Alkaline Phosphatase: 61 U/L (ref 39–117)
BILIRUBIN TOTAL: 0.3 mg/dL (ref 0.2–1.2)
BUN: 17 mg/dL (ref 6–23)
CO2: 25 meq/L (ref 19–32)
CREATININE: 1.01 mg/dL (ref 0.50–1.35)
Calcium: 9.6 mg/dL (ref 8.4–10.5)
Chloride: 104 mEq/L (ref 96–112)
Glucose, Bld: 97 mg/dL (ref 70–99)
Potassium: 4.8 mEq/L (ref 3.5–5.3)
Sodium: 139 mEq/L (ref 135–145)
Total Protein: 7 g/dL (ref 6.0–8.3)

## 2014-01-18 LAB — LIPID PANEL
CHOL/HDL RATIO: 4.1 ratio
Cholesterol: 190 mg/dL (ref 0–200)
HDL: 46 mg/dL (ref >39)
LDL CALC: 121 mg/dL — AB (ref 0–99)
Triglycerides: 116 mg/dL (ref <150)
VLDL: 23 mg/dL (ref 0–40)

## 2014-01-18 LAB — CBC WITH DIFFERENTIAL/PLATELET
BASOS ABS: 0 10*3/uL (ref 0.0–0.1)
Basophils Relative: 0 % (ref 0–1)
EOS ABS: 0.1 10*3/uL (ref 0.0–0.7)
EOS PCT: 2 % (ref 0–5)
HCT: 37.1 % — ABNORMAL LOW (ref 39.0–52.0)
Hemoglobin: 12.9 g/dL — ABNORMAL LOW (ref 13.0–17.0)
LYMPHS ABS: 1.5 10*3/uL (ref 0.7–4.0)
LYMPHS PCT: 23 % (ref 12–46)
MCH: 29.1 pg (ref 26.0–34.0)
MCHC: 34.8 g/dL (ref 30.0–36.0)
MCV: 83.7 fL (ref 78.0–100.0)
Monocytes Absolute: 0.5 10*3/uL (ref 0.1–1.0)
Monocytes Relative: 8 % (ref 3–12)
NEUTROS PCT: 67 % (ref 43–77)
Neutro Abs: 4.4 10*3/uL (ref 1.7–7.7)
PLATELETS: 280 10*3/uL (ref 150–400)
RBC: 4.43 MIL/uL (ref 4.22–5.81)
RDW: 13.2 % (ref 11.5–15.5)
WBC: 6.5 10*3/uL (ref 4.0–10.5)

## 2014-01-18 MED ORDER — ATORVASTATIN CALCIUM 10 MG PO TABS: 10.0000 mg | ORAL_TABLET | Freq: Every day | ORAL | Status: DC

## 2014-01-18 MED ORDER — LISINOPRIL 5 MG PO TABS: 5.0000 mg | ORAL_TABLET | Freq: Every day | ORAL | Status: DC

## 2014-01-18 NOTE — Progress Notes (Signed)
Subjective:    Patient ID: Jorge Molina., male    DOB: 1961/03/05, 53 y.o.   MRN: [id number]  01/18/2014  Follow-up   HPI This 53 y.o. male presents for four month follow-up:  1. HTN: no changes to management made at last visit.  Patient reports good compliance with medication, good tolerance to medication, and good symptom control.  Home BP readings running 117-120 and 79-81.  Not exercising. No weight loss; lots of work stressors.  2. Hyperlipidemia: no changes to management made at last visit.  Patient reports good compliance with medication, good tolerance to medication, and good symptom control.  Fasting today; due for repeat labs.  3.  ED:  Discussed use of Viagra with Dr. Zigmund Daniel of ophthalmology.  TSH normal 07/2013.  Discussed with Dr. Zigmund Daniel on 10/05/13.    4.  Central retinal vein occlusion R eye:  Going every 4 weeks by Dr. Zigmund Daniel.  Received injection weekly for next 3 years.    5. Colon cancer screening: referred for colonoscopy at [facility] in 09/2013.  Robert Butler/GI in [place]. Went in for nursing visit; scheduled for 02/08/14.     Review of Systems  Constitutional: Negative for fever, chills, diaphoresis and fatigue.  Eyes: Negative for photophobia and visual disturbance.  Respiratory: Negative for cough, shortness of breath, wheezing and stridor.   Cardiovascular: Negative for chest pain, palpitations and leg swelling.  Gastrointestinal: Negative for nausea, vomiting, abdominal pain, diarrhea, constipation, blood in stool, anal bleeding and rectal pain.  Skin: Negative for rash.  Neurological: Negative for dizziness, tremors, seizures, syncope, facial asymmetry, speech difficulty, weakness, light-headedness, numbness and headaches.    Past Medical History  Diagnosis Date  . Central retinal vein occlusion of right eye 03/18/2013    Zigmund Daniel  . Hypertension   . Hyperlipidemia   . ED (erectile dysfunction)    Past Surgical History  Procedure Laterality Date  .  Tonsillectomy      No Known Allergies Current Outpatient Prescriptions  Medication Sig Dispense Refill  . atorvastatin (LIPITOR) 10 MG tablet Take 1 tablet (10 mg total) by mouth daily.  90 tablet  3  . lisinopril (PRINIVIL,ZESTRIL) 5 MG tablet Take 1 tablet (5 mg total) by mouth daily.  90 tablet  1  . Multiple Vitamin (MULTIVITAMIN) tablet Take 1 tablet by mouth daily.       No current facility-administered medications for this visit.   History   Social History  . Marital Status: Single    Spouse Name: N/A    Number of Children: N/A  . Years of Education: N/A   Occupational History  . Not on file.   Social History Main Topics  . Smoking status: Former Smoker -- 20 years    Types: Cigars    Quit date: 08/18/2000  . Smokeless tobacco: Not on file     Comment: smoked on/off  . Alcohol Use: 6.0 oz/week    10 Glasses of wine per week     Comment: beer and wine  . Drug Use: No  . Sexual Activity: Yes   Other Topics Concern  . Not on file   Social History Narrative   Marital status: single; dating.      Children: none      Employment:  Theatre stage manager       Tobacco: none       Alcohol:  1-2 beers and wine per day.  No DWIs.      Drugs: none  Exercise: none      Seatbelt:  100%      Guns:  Loaded guns.        Sexual activity: sexually active; no STDs; last STD screening 07/2013.  Total sexual partners = 40s.    75% of time uses condoms.  Females.              Objective:    BP 108/80  Pulse 77  Temp(Src) 98.9 F (37.2 C) (Oral)  Resp 16  Ht 5' 5.5" (1.664 m)  Wt 210 lb (95.255 kg)  BMI 34.40 kg/m2  SpO2 97% Physical Exam  Nursing note and vitals reviewed. Constitutional: He is oriented to person, place, and time. He appears well-developed and well-nourished. No distress.  HENT:  Head: Normocephalic and atraumatic.  Eyes: Conjunctivae and EOM are normal. Pupils are equal, round, and reactive to light.  Neck: Normal range  of motion. Neck supple. Carotid bruit is not present. No thyromegaly present.  Cardiovascular: Normal rate, regular rhythm, normal heart sounds and intact distal pulses.  Exam reveals no gallop and no friction rub.   No murmur heard. Pulmonary/Chest: Effort normal and breath sounds normal. He has no wheezes. He has no rales.  Abdominal: Soft. Bowel sounds are normal.  Lymphadenopathy:    He has no cervical adenopathy.  Neurological: He is alert and oriented to person, place, and time. No cranial nerve deficit.  Skin: Skin is warm and dry. No rash noted. He is not diaphoretic.  Psychiatric: He has a normal mood and affect. His behavior is normal.   Results for orders placed in visit on 01/18/14  CBC WITH DIFFERENTIAL      Result Value Ref Range   WBC 6.5  4.0 - 10.5 K/uL   RBC 4.43  4.22 - 5.81 MIL/uL   Hemoglobin 12.9 (*) 13.0 - 17.0 g/dL   HCT 37.1 (*) 39.0 - 52.0 %   MCV 83.7  78.0 - 100.0 fL   MCH 29.1  26.0 - 34.0 pg   MCHC 34.8  30.0 - 36.0 g/dL   RDW 13.2  11.5 - 15.5 %   Platelets 280  150 - 400 K/uL   Neutrophils Relative % 67  43 - 77 %   Neutro Abs 4.4  1.7 - 7.7 K/uL   Lymphocytes Relative 23  12 - 46 %   Lymphs Abs 1.5  0.7 - 4.0 K/uL   Monocytes Relative 8  3 - 12 %   Monocytes Absolute 0.5  0.1 - 1.0 K/uL   Eosinophils Relative 2  0 - 5 %   Eosinophils Absolute 0.1  0.0 - 0.7 K/uL   Basophils Relative 0  0 - 1 %   Basophils Absolute 0.0  0.0 - 0.1 K/uL   Smear Review Criteria for review not met    COMPREHENSIVE METABOLIC PANEL      Result Value Ref Range   Sodium 139  135 - 145 mEq/L   Potassium 4.8  3.5 - 5.3 mEq/L   Chloride 104  96 - 112 mEq/L   CO2 25  19 - 32 mEq/L   Glucose, Bld 97  70 - 99 mg/dL   BUN 17  6 - 23 mg/dL   Creat 1.01  0.50 - 1.35 mg/dL   Total Bilirubin 0.3  0.2 - 1.2 mg/dL   Alkaline Phosphatase 61  39 - 117 U/L   AST 17  0 - 37 U/L   ALT 25  0 - 53 U/L  Total Protein 7.0  6.0 - 8.3 g/dL   Albumin 4.4  3.5 - 5.2 g/dL   Calcium 9.6   8.4 - 10.5 mg/dL  LIPID PANEL      Result Value Ref Range   Cholesterol 190  0 - 200 mg/dL   Triglycerides 116  <150 mg/dL   HDL 46  >39 mg/dL   Total CHOL/HDL Ratio 4.1     VLDL 23  0 - 40 mg/dL   LDL Cholesterol 121 (*) 0 - 99 mg/dL       Assessment & Plan:  Essential hypertension, benign  Pure hypercholesterolemia - Plan: CBC with Differential, Comprehensive metabolic panel, Lipid panel  Obesity (BMI 35.0-39.9 without comorbidity)  Central retinal vein occlusion of right eye  Erectile dysfunction  1. HTN: controlled; obtain labs; no changes to management made. 2.  Hypercholesterolemia:  Controlled; obtain labs; continue current medications. 3. Obesity: persistent; no weight loss in past 4 months.  Encourage weight loss, exercise. 4.  Central retinal vein occlusion of R eye:  Stable; undergoing injections monthly. 5.  ED: uncontrolled.  Will obtain records from ophthalmology; will also contact Dr. Zigmund Daniel for verbal consent to prescribe Viagra.  Meds ordered this encounter  Medications  . atorvastatin (LIPITOR) 10 MG tablet    Sig: Take 1 tablet (10 mg total) by mouth daily.    Dispense:  90 tablet    Refill:  3  . lisinopril (PRINIVIL,ZESTRIL) 5 MG tablet    Sig: Take 1 tablet (5 mg total) by mouth daily.    Dispense:  90 tablet    Refill:  1    Return in about 6 months (around 07/20/2014) for recheck on high blood pressure, high cholesterol.    Reginia Forts, M.D.  [facility] [address] [place], [place]  [address] [phone] phone [phone] fax

## 2014-01-19 MED ORDER — ATORVASTATIN CALCIUM 20 MG PO TABS: 20.0000 mg | ORAL_TABLET | Freq: Every day | ORAL | Status: DC

## 2014-01-19 NOTE — Addendum Note (Signed)
Addended by: Ethelda Chick on: 01/19/2014 10:32 AM   Modules accepted: Orders

## 2014-01-20 ENCOUNTER — Telehealth: Payer: Self-pay

## 2014-01-20 NOTE — Telephone Encounter (Signed)
Patient returned call. States that our office is requesting the records. Will fax request now.

## 2014-01-20 NOTE — Telephone Encounter (Signed)
Called and left patient a voicemail regarding his MR request. The written request form from our office reads that we will be receiving records from Dr. [name] - however, patient wrote that he has an appt on 6/10 that he needs the records for - that appointment is not at our facility and is instead at Dr. Anastasio Auerbach office. Asked patient to please clarify if we are sending or receiving records before I process this release. Placed in pending folder for now.

## 2014-01-25 ENCOUNTER — Encounter (INDEPENDENT_AMBULATORY_CARE_PROVIDER_SITE_OTHER): Payer: 59 | Admitting: Ophthalmology

## 2014-01-25 DIAGNOSIS — I10 Essential (primary) hypertension: Secondary | ICD-10-CM

## 2014-01-25 DIAGNOSIS — H348192 Central retinal vein occlusion, unspecified eye, stable: Secondary | ICD-10-CM

## 2014-01-25 DIAGNOSIS — H43819 Vitreous degeneration, unspecified eye: Secondary | ICD-10-CM

## 2014-01-25 DIAGNOSIS — H2510 Age-related nuclear cataract, unspecified eye: Secondary | ICD-10-CM

## 2014-01-25 DIAGNOSIS — H35039 Hypertensive retinopathy, unspecified eye: Secondary | ICD-10-CM

## 2014-02-22 ENCOUNTER — Encounter (INDEPENDENT_AMBULATORY_CARE_PROVIDER_SITE_OTHER): Payer: 59 | Admitting: Ophthalmology

## 2014-02-22 DIAGNOSIS — H43819 Vitreous degeneration, unspecified eye: Secondary | ICD-10-CM

## 2014-02-22 DIAGNOSIS — H348192 Central retinal vein occlusion, unspecified eye, stable: Secondary | ICD-10-CM

## 2014-02-22 DIAGNOSIS — I10 Essential (primary) hypertension: Secondary | ICD-10-CM

## 2014-02-22 DIAGNOSIS — H35039 Hypertensive retinopathy, unspecified eye: Secondary | ICD-10-CM

## 2014-03-06 ENCOUNTER — Other Ambulatory Visit (INDEPENDENT_AMBULATORY_CARE_PROVIDER_SITE_OTHER): Payer: 59 | Admitting: Family Medicine

## 2014-03-06 DIAGNOSIS — D649 Anemia, unspecified: Secondary | ICD-10-CM

## 2014-03-06 LAB — IRON AND TIBC
%SAT: 19 % — ABNORMAL LOW (ref 20–55)
IRON: 64 ug/dL (ref 42–165)
TIBC: 338 ug/dL (ref 215–435)
UIBC: 274 ug/dL (ref 125–400)

## 2014-03-06 LAB — CBC WITH DIFFERENTIAL/PLATELET
BASOS PCT: 0 % (ref 0–1)
Basophils Absolute: 0 10*3/uL (ref 0.0–0.1)
Eosinophils Absolute: 0.1 10*3/uL (ref 0.0–0.7)
Eosinophils Relative: 1 % (ref 0–5)
HCT: 37.9 % — ABNORMAL LOW (ref 39.0–52.0)
HEMOGLOBIN: 13.1 g/dL (ref 13.0–17.0)
LYMPHS PCT: 24 % (ref 12–46)
Lymphs Abs: 1.8 10*3/uL (ref 0.7–4.0)
MCH: 29.6 pg (ref 26.0–34.0)
MCHC: 34.6 g/dL (ref 30.0–36.0)
MCV: 85.6 fL (ref 78.0–100.0)
Monocytes Absolute: 0.6 10*3/uL (ref 0.1–1.0)
Monocytes Relative: 8 % (ref 3–12)
NEUTROS PCT: 67 % (ref 43–77)
Neutro Abs: 5.2 10*3/uL (ref 1.7–7.7)
Platelets: 283 10*3/uL (ref 150–400)
RBC: 4.43 MIL/uL (ref 4.22–5.81)
RDW: 13.4 % (ref 11.5–15.5)
WBC: 7.7 10*3/uL (ref 4.0–10.5)

## 2014-03-07 LAB — VITAMIN B12: Vitamin B-12: 287 pg/mL (ref 211–911)

## 2014-03-22 ENCOUNTER — Encounter (INDEPENDENT_AMBULATORY_CARE_PROVIDER_SITE_OTHER): Payer: 59 | Admitting: Ophthalmology

## 2014-03-22 DIAGNOSIS — H35039 Hypertensive retinopathy, unspecified eye: Secondary | ICD-10-CM

## 2014-03-22 DIAGNOSIS — H348192 Central retinal vein occlusion, unspecified eye, stable: Secondary | ICD-10-CM

## 2014-03-22 DIAGNOSIS — H35379 Puckering of macula, unspecified eye: Secondary | ICD-10-CM

## 2014-03-22 DIAGNOSIS — H2510 Age-related nuclear cataract, unspecified eye: Secondary | ICD-10-CM

## 2014-03-22 DIAGNOSIS — I10 Essential (primary) hypertension: Secondary | ICD-10-CM

## 2014-04-26 ENCOUNTER — Encounter (INDEPENDENT_AMBULATORY_CARE_PROVIDER_SITE_OTHER): Payer: 59 | Admitting: Ophthalmology

## 2014-04-26 DIAGNOSIS — I10 Essential (primary) hypertension: Secondary | ICD-10-CM

## 2014-04-26 DIAGNOSIS — H348192 Central retinal vein occlusion, unspecified eye, stable: Secondary | ICD-10-CM

## 2014-04-26 DIAGNOSIS — H2510 Age-related nuclear cataract, unspecified eye: Secondary | ICD-10-CM

## 2014-04-26 DIAGNOSIS — H43819 Vitreous degeneration, unspecified eye: Secondary | ICD-10-CM

## 2014-04-26 DIAGNOSIS — H35379 Puckering of macula, unspecified eye: Secondary | ICD-10-CM

## 2014-04-26 DIAGNOSIS — H35039 Hypertensive retinopathy, unspecified eye: Secondary | ICD-10-CM

## 2014-05-24 ENCOUNTER — Encounter (INDEPENDENT_AMBULATORY_CARE_PROVIDER_SITE_OTHER): Payer: 59 | Admitting: Ophthalmology

## 2014-05-24 DIAGNOSIS — H34811 Central retinal vein occlusion, right eye: Secondary | ICD-10-CM

## 2014-05-24 DIAGNOSIS — H35033 Hypertensive retinopathy, bilateral: Secondary | ICD-10-CM

## 2014-05-24 DIAGNOSIS — H43813 Vitreous degeneration, bilateral: Secondary | ICD-10-CM

## 2014-06-21 ENCOUNTER — Encounter (INDEPENDENT_AMBULATORY_CARE_PROVIDER_SITE_OTHER): Payer: 59 | Admitting: Ophthalmology

## 2014-06-21 DIAGNOSIS — H43813 Vitreous degeneration, bilateral: Secondary | ICD-10-CM

## 2014-06-21 DIAGNOSIS — H34811 Central retinal vein occlusion, right eye: Secondary | ICD-10-CM

## 2014-06-21 DIAGNOSIS — I10 Essential (primary) hypertension: Secondary | ICD-10-CM

## 2014-06-21 DIAGNOSIS — H35033 Hypertensive retinopathy, bilateral: Secondary | ICD-10-CM

## 2014-06-21 DIAGNOSIS — H2511 Age-related nuclear cataract, right eye: Secondary | ICD-10-CM

## 2014-07-19 ENCOUNTER — Encounter (INDEPENDENT_AMBULATORY_CARE_PROVIDER_SITE_OTHER): Payer: 59 | Admitting: Ophthalmology

## 2014-07-19 DIAGNOSIS — H35033 Hypertensive retinopathy, bilateral: Secondary | ICD-10-CM

## 2014-07-19 DIAGNOSIS — H34811 Central retinal vein occlusion, right eye: Secondary | ICD-10-CM

## 2014-07-19 DIAGNOSIS — H43813 Vitreous degeneration, bilateral: Secondary | ICD-10-CM

## 2014-07-19 DIAGNOSIS — I10 Essential (primary) hypertension: Secondary | ICD-10-CM

## 2014-07-24 ENCOUNTER — Ambulatory Visit: Payer: 59 | Admitting: Family Medicine

## 2014-08-15 ENCOUNTER — Encounter (INDEPENDENT_AMBULATORY_CARE_PROVIDER_SITE_OTHER): Payer: 59 | Admitting: Ophthalmology

## 2014-08-15 DIAGNOSIS — I10 Essential (primary) hypertension: Secondary | ICD-10-CM

## 2014-08-15 DIAGNOSIS — H35033 Hypertensive retinopathy, bilateral: Secondary | ICD-10-CM

## 2014-08-15 DIAGNOSIS — H2513 Age-related nuclear cataract, bilateral: Secondary | ICD-10-CM

## 2014-08-15 DIAGNOSIS — H43813 Vitreous degeneration, bilateral: Secondary | ICD-10-CM

## 2014-08-15 DIAGNOSIS — H34811 Central retinal vein occlusion, right eye: Secondary | ICD-10-CM

## 2014-09-20 ENCOUNTER — Encounter (INDEPENDENT_AMBULATORY_CARE_PROVIDER_SITE_OTHER): Payer: 59 | Admitting: Ophthalmology

## 2014-09-20 DIAGNOSIS — H34811 Central retinal vein occlusion, right eye: Secondary | ICD-10-CM

## 2014-09-20 DIAGNOSIS — H43813 Vitreous degeneration, bilateral: Secondary | ICD-10-CM

## 2014-09-20 DIAGNOSIS — H35033 Hypertensive retinopathy, bilateral: Secondary | ICD-10-CM

## 2014-09-20 DIAGNOSIS — I10 Essential (primary) hypertension: Secondary | ICD-10-CM

## 2014-10-17 ENCOUNTER — Other Ambulatory Visit: Payer: Self-pay | Admitting: Family Medicine

## 2014-10-18 ENCOUNTER — Encounter (INDEPENDENT_AMBULATORY_CARE_PROVIDER_SITE_OTHER): Payer: 59 | Admitting: Ophthalmology

## 2014-10-18 ENCOUNTER — Other Ambulatory Visit: Payer: Self-pay | Admitting: Physician Assistant

## 2014-10-18 DIAGNOSIS — I10 Essential (primary) hypertension: Secondary | ICD-10-CM

## 2014-10-18 DIAGNOSIS — H35033 Hypertensive retinopathy, bilateral: Secondary | ICD-10-CM | POA: Diagnosis not present

## 2014-10-18 DIAGNOSIS — H43813 Vitreous degeneration, bilateral: Secondary | ICD-10-CM

## 2014-10-18 DIAGNOSIS — H2513 Age-related nuclear cataract, bilateral: Secondary | ICD-10-CM

## 2014-10-18 DIAGNOSIS — H34811 Central retinal vein occlusion, right eye: Secondary | ICD-10-CM | POA: Diagnosis not present

## 2014-11-22 ENCOUNTER — Encounter (INDEPENDENT_AMBULATORY_CARE_PROVIDER_SITE_OTHER): Payer: 59 | Admitting: Ophthalmology

## 2014-11-22 DIAGNOSIS — H35033 Hypertensive retinopathy, bilateral: Secondary | ICD-10-CM | POA: Diagnosis not present

## 2014-11-22 DIAGNOSIS — H34811 Central retinal vein occlusion, right eye: Secondary | ICD-10-CM | POA: Diagnosis not present

## 2014-11-22 DIAGNOSIS — H2513 Age-related nuclear cataract, bilateral: Secondary | ICD-10-CM

## 2014-11-22 DIAGNOSIS — H43813 Vitreous degeneration, bilateral: Secondary | ICD-10-CM | POA: Diagnosis not present

## 2014-11-22 DIAGNOSIS — I10 Essential (primary) hypertension: Secondary | ICD-10-CM

## 2014-11-26 ENCOUNTER — Other Ambulatory Visit: Payer: Self-pay | Admitting: Physician Assistant

## 2014-11-27 ENCOUNTER — Other Ambulatory Visit: Payer: Self-pay | Admitting: Family Medicine

## 2014-12-20 ENCOUNTER — Encounter (INDEPENDENT_AMBULATORY_CARE_PROVIDER_SITE_OTHER): Payer: 59 | Admitting: Ophthalmology

## 2014-12-20 DIAGNOSIS — I10 Essential (primary) hypertension: Secondary | ICD-10-CM | POA: Diagnosis not present

## 2014-12-20 DIAGNOSIS — H43813 Vitreous degeneration, bilateral: Secondary | ICD-10-CM

## 2014-12-20 DIAGNOSIS — H35033 Hypertensive retinopathy, bilateral: Secondary | ICD-10-CM | POA: Diagnosis not present

## 2014-12-20 DIAGNOSIS — H34811 Central retinal vein occlusion, right eye: Secondary | ICD-10-CM | POA: Diagnosis not present

## 2015-01-19 ENCOUNTER — Other Ambulatory Visit: Payer: Self-pay | Admitting: Family Medicine

## 2015-01-22 ENCOUNTER — Other Ambulatory Visit: Payer: Self-pay | Admitting: Family Medicine

## 2015-01-24 ENCOUNTER — Encounter (INDEPENDENT_AMBULATORY_CARE_PROVIDER_SITE_OTHER): Payer: 59 | Admitting: Ophthalmology

## 2015-01-24 DIAGNOSIS — H35371 Puckering of macula, right eye: Secondary | ICD-10-CM

## 2015-01-24 DIAGNOSIS — H43813 Vitreous degeneration, bilateral: Secondary | ICD-10-CM | POA: Diagnosis not present

## 2015-01-24 DIAGNOSIS — I10 Essential (primary) hypertension: Secondary | ICD-10-CM

## 2015-01-24 DIAGNOSIS — H35033 Hypertensive retinopathy, bilateral: Secondary | ICD-10-CM

## 2015-01-24 DIAGNOSIS — H34811 Central retinal vein occlusion, right eye: Secondary | ICD-10-CM | POA: Diagnosis not present

## 2015-02-23 ENCOUNTER — Encounter (INDEPENDENT_AMBULATORY_CARE_PROVIDER_SITE_OTHER): Payer: 59 | Admitting: Ophthalmology

## 2015-02-23 DIAGNOSIS — H35033 Hypertensive retinopathy, bilateral: Secondary | ICD-10-CM | POA: Diagnosis not present

## 2015-02-23 DIAGNOSIS — H34811 Central retinal vein occlusion, right eye: Secondary | ICD-10-CM

## 2015-02-23 DIAGNOSIS — I10 Essential (primary) hypertension: Secondary | ICD-10-CM

## 2015-02-23 DIAGNOSIS — H43813 Vitreous degeneration, bilateral: Secondary | ICD-10-CM | POA: Diagnosis not present

## 2015-02-23 DIAGNOSIS — H35371 Puckering of macula, right eye: Secondary | ICD-10-CM | POA: Diagnosis not present

## 2015-03-30 ENCOUNTER — Encounter (INDEPENDENT_AMBULATORY_CARE_PROVIDER_SITE_OTHER): Payer: 59 | Admitting: Ophthalmology

## 2015-03-30 DIAGNOSIS — H34811 Central retinal vein occlusion, right eye: Secondary | ICD-10-CM

## 2015-03-30 DIAGNOSIS — H35371 Puckering of macula, right eye: Secondary | ICD-10-CM

## 2015-03-30 DIAGNOSIS — H43813 Vitreous degeneration, bilateral: Secondary | ICD-10-CM

## 2015-03-30 DIAGNOSIS — I10 Essential (primary) hypertension: Secondary | ICD-10-CM | POA: Diagnosis not present

## 2015-03-30 DIAGNOSIS — H35033 Hypertensive retinopathy, bilateral: Secondary | ICD-10-CM | POA: Diagnosis not present

## 2015-05-04 ENCOUNTER — Encounter (INDEPENDENT_AMBULATORY_CARE_PROVIDER_SITE_OTHER): Payer: 59 | Admitting: Ophthalmology

## 2015-05-04 DIAGNOSIS — H43813 Vitreous degeneration, bilateral: Secondary | ICD-10-CM

## 2015-05-04 DIAGNOSIS — H35033 Hypertensive retinopathy, bilateral: Secondary | ICD-10-CM | POA: Diagnosis not present

## 2015-05-04 DIAGNOSIS — H35371 Puckering of macula, right eye: Secondary | ICD-10-CM | POA: Diagnosis not present

## 2015-05-04 DIAGNOSIS — H34811 Central retinal vein occlusion, right eye: Secondary | ICD-10-CM | POA: Diagnosis not present

## 2015-05-04 DIAGNOSIS — H2513 Age-related nuclear cataract, bilateral: Secondary | ICD-10-CM | POA: Diagnosis not present

## 2015-05-04 DIAGNOSIS — I10 Essential (primary) hypertension: Secondary | ICD-10-CM | POA: Diagnosis not present

## 2015-06-08 ENCOUNTER — Encounter (INDEPENDENT_AMBULATORY_CARE_PROVIDER_SITE_OTHER): Payer: 59 | Admitting: Ophthalmology

## 2015-06-08 DIAGNOSIS — H35033 Hypertensive retinopathy, bilateral: Secondary | ICD-10-CM

## 2015-06-08 DIAGNOSIS — H34811 Central retinal vein occlusion, right eye, with macular edema: Secondary | ICD-10-CM

## 2015-06-08 DIAGNOSIS — I10 Essential (primary) hypertension: Secondary | ICD-10-CM | POA: Diagnosis not present

## 2015-06-08 DIAGNOSIS — H43813 Vitreous degeneration, bilateral: Secondary | ICD-10-CM

## 2015-07-11 ENCOUNTER — Encounter (INDEPENDENT_AMBULATORY_CARE_PROVIDER_SITE_OTHER): Payer: 59 | Admitting: Ophthalmology

## 2015-07-11 DIAGNOSIS — H43813 Vitreous degeneration, bilateral: Secondary | ICD-10-CM | POA: Diagnosis not present

## 2015-07-11 DIAGNOSIS — H34811 Central retinal vein occlusion, right eye, with macular edema: Secondary | ICD-10-CM | POA: Diagnosis not present

## 2015-07-11 DIAGNOSIS — H35033 Hypertensive retinopathy, bilateral: Secondary | ICD-10-CM | POA: Diagnosis not present

## 2015-07-11 DIAGNOSIS — I10 Essential (primary) hypertension: Secondary | ICD-10-CM

## 2015-08-24 ENCOUNTER — Encounter (INDEPENDENT_AMBULATORY_CARE_PROVIDER_SITE_OTHER): Payer: 59 | Admitting: Ophthalmology

## 2015-08-24 DIAGNOSIS — H35371 Puckering of macula, right eye: Secondary | ICD-10-CM

## 2015-08-24 DIAGNOSIS — H34811 Central retinal vein occlusion, right eye, with macular edema: Secondary | ICD-10-CM

## 2015-08-24 DIAGNOSIS — I10 Essential (primary) hypertension: Secondary | ICD-10-CM | POA: Diagnosis not present

## 2015-08-24 DIAGNOSIS — H2512 Age-related nuclear cataract, left eye: Secondary | ICD-10-CM

## 2015-08-24 DIAGNOSIS — H35033 Hypertensive retinopathy, bilateral: Secondary | ICD-10-CM | POA: Diagnosis not present

## 2015-08-24 DIAGNOSIS — H43813 Vitreous degeneration, bilateral: Secondary | ICD-10-CM

## 2015-10-05 ENCOUNTER — Encounter (INDEPENDENT_AMBULATORY_CARE_PROVIDER_SITE_OTHER): Payer: 59 | Admitting: Ophthalmology

## 2015-10-05 DIAGNOSIS — H34811 Central retinal vein occlusion, right eye, with macular edema: Secondary | ICD-10-CM

## 2015-10-05 DIAGNOSIS — I10 Essential (primary) hypertension: Secondary | ICD-10-CM | POA: Diagnosis not present

## 2015-10-05 DIAGNOSIS — H35033 Hypertensive retinopathy, bilateral: Secondary | ICD-10-CM

## 2015-10-05 DIAGNOSIS — H43813 Vitreous degeneration, bilateral: Secondary | ICD-10-CM

## 2015-10-05 DIAGNOSIS — H35371 Puckering of macula, right eye: Secondary | ICD-10-CM | POA: Diagnosis not present

## 2015-11-23 ENCOUNTER — Encounter (INDEPENDENT_AMBULATORY_CARE_PROVIDER_SITE_OTHER): Payer: 59 | Admitting: Ophthalmology

## 2015-11-23 DIAGNOSIS — H35371 Puckering of macula, right eye: Secondary | ICD-10-CM | POA: Diagnosis not present

## 2015-11-23 DIAGNOSIS — H43813 Vitreous degeneration, bilateral: Secondary | ICD-10-CM | POA: Diagnosis not present

## 2015-11-23 DIAGNOSIS — I10 Essential (primary) hypertension: Secondary | ICD-10-CM | POA: Diagnosis not present

## 2015-11-23 DIAGNOSIS — H35033 Hypertensive retinopathy, bilateral: Secondary | ICD-10-CM

## 2015-11-23 DIAGNOSIS — H34811 Central retinal vein occlusion, right eye, with macular edema: Secondary | ICD-10-CM

## 2015-11-23 DIAGNOSIS — H2513 Age-related nuclear cataract, bilateral: Secondary | ICD-10-CM | POA: Diagnosis not present

## 2016-01-18 ENCOUNTER — Encounter (INDEPENDENT_AMBULATORY_CARE_PROVIDER_SITE_OTHER): Payer: 59 | Admitting: Ophthalmology

## 2016-01-18 DIAGNOSIS — H35033 Hypertensive retinopathy, bilateral: Secondary | ICD-10-CM | POA: Diagnosis not present

## 2016-01-18 DIAGNOSIS — I10 Essential (primary) hypertension: Secondary | ICD-10-CM | POA: Diagnosis not present

## 2016-01-18 DIAGNOSIS — H34811 Central retinal vein occlusion, right eye, with macular edema: Secondary | ICD-10-CM | POA: Diagnosis not present

## 2016-01-18 DIAGNOSIS — H35371 Puckering of macula, right eye: Secondary | ICD-10-CM

## 2016-01-18 DIAGNOSIS — H2513 Age-related nuclear cataract, bilateral: Secondary | ICD-10-CM | POA: Diagnosis not present

## 2016-01-18 DIAGNOSIS — H43813 Vitreous degeneration, bilateral: Secondary | ICD-10-CM | POA: Diagnosis not present

## 2016-03-14 ENCOUNTER — Encounter (INDEPENDENT_AMBULATORY_CARE_PROVIDER_SITE_OTHER): Payer: 59 | Admitting: Ophthalmology

## 2016-03-14 DIAGNOSIS — H2513 Age-related nuclear cataract, bilateral: Secondary | ICD-10-CM

## 2016-03-14 DIAGNOSIS — I10 Essential (primary) hypertension: Secondary | ICD-10-CM | POA: Diagnosis not present

## 2016-03-14 DIAGNOSIS — H34811 Central retinal vein occlusion, right eye, with macular edema: Secondary | ICD-10-CM | POA: Diagnosis not present

## 2016-03-14 DIAGNOSIS — H43813 Vitreous degeneration, bilateral: Secondary | ICD-10-CM

## 2016-03-14 DIAGNOSIS — H35033 Hypertensive retinopathy, bilateral: Secondary | ICD-10-CM

## 2016-05-16 ENCOUNTER — Encounter (INDEPENDENT_AMBULATORY_CARE_PROVIDER_SITE_OTHER): Payer: 59 | Admitting: Ophthalmology

## 2016-05-16 DIAGNOSIS — H2513 Age-related nuclear cataract, bilateral: Secondary | ICD-10-CM | POA: Diagnosis not present

## 2016-05-16 DIAGNOSIS — H34811 Central retinal vein occlusion, right eye, with macular edema: Secondary | ICD-10-CM | POA: Diagnosis not present

## 2016-05-16 DIAGNOSIS — H35033 Hypertensive retinopathy, bilateral: Secondary | ICD-10-CM

## 2016-05-16 DIAGNOSIS — H43813 Vitreous degeneration, bilateral: Secondary | ICD-10-CM

## 2016-05-16 DIAGNOSIS — I10 Essential (primary) hypertension: Secondary | ICD-10-CM | POA: Diagnosis not present

## 2016-06-20 ENCOUNTER — Other Ambulatory Visit (INDEPENDENT_AMBULATORY_CARE_PROVIDER_SITE_OTHER): Payer: 59 | Admitting: Ophthalmology

## 2016-06-20 DIAGNOSIS — H34811 Central retinal vein occlusion, right eye, with macular edema: Secondary | ICD-10-CM | POA: Diagnosis not present

## 2016-08-01 ENCOUNTER — Encounter (INDEPENDENT_AMBULATORY_CARE_PROVIDER_SITE_OTHER): Payer: 59 | Admitting: Ophthalmology

## 2016-08-01 DIAGNOSIS — I10 Essential (primary) hypertension: Secondary | ICD-10-CM

## 2016-08-01 DIAGNOSIS — H34811 Central retinal vein occlusion, right eye, with macular edema: Secondary | ICD-10-CM

## 2016-08-01 DIAGNOSIS — H35033 Hypertensive retinopathy, bilateral: Secondary | ICD-10-CM

## 2016-08-01 DIAGNOSIS — H43813 Vitreous degeneration, bilateral: Secondary | ICD-10-CM | POA: Diagnosis not present

## 2016-09-26 ENCOUNTER — Encounter (INDEPENDENT_AMBULATORY_CARE_PROVIDER_SITE_OTHER): Payer: 59 | Admitting: Ophthalmology

## 2016-09-26 DIAGNOSIS — H35033 Hypertensive retinopathy, bilateral: Secondary | ICD-10-CM

## 2016-09-26 DIAGNOSIS — H43813 Vitreous degeneration, bilateral: Secondary | ICD-10-CM | POA: Diagnosis not present

## 2016-09-26 DIAGNOSIS — H2513 Age-related nuclear cataract, bilateral: Secondary | ICD-10-CM | POA: Diagnosis not present

## 2016-09-26 DIAGNOSIS — H34811 Central retinal vein occlusion, right eye, with macular edema: Secondary | ICD-10-CM | POA: Diagnosis not present

## 2016-09-26 DIAGNOSIS — I10 Essential (primary) hypertension: Secondary | ICD-10-CM | POA: Diagnosis not present

## 2016-11-26 ENCOUNTER — Encounter (INDEPENDENT_AMBULATORY_CARE_PROVIDER_SITE_OTHER): Payer: 59 | Admitting: Ophthalmology

## 2016-11-26 DIAGNOSIS — H34811 Central retinal vein occlusion, right eye, with macular edema: Secondary | ICD-10-CM | POA: Diagnosis not present

## 2016-11-26 DIAGNOSIS — I10 Essential (primary) hypertension: Secondary | ICD-10-CM | POA: Diagnosis not present

## 2016-11-26 DIAGNOSIS — H43813 Vitreous degeneration, bilateral: Secondary | ICD-10-CM | POA: Diagnosis not present

## 2016-11-26 DIAGNOSIS — H35033 Hypertensive retinopathy, bilateral: Secondary | ICD-10-CM | POA: Diagnosis not present

## 2017-02-04 ENCOUNTER — Encounter (INDEPENDENT_AMBULATORY_CARE_PROVIDER_SITE_OTHER): Payer: 59 | Admitting: Ophthalmology

## 2017-02-04 DIAGNOSIS — I10 Essential (primary) hypertension: Secondary | ICD-10-CM

## 2017-02-04 DIAGNOSIS — H34811 Central retinal vein occlusion, right eye, with macular edema: Secondary | ICD-10-CM | POA: Diagnosis not present

## 2017-02-04 DIAGNOSIS — H2513 Age-related nuclear cataract, bilateral: Secondary | ICD-10-CM | POA: Diagnosis not present

## 2017-02-04 DIAGNOSIS — H43813 Vitreous degeneration, bilateral: Secondary | ICD-10-CM | POA: Diagnosis not present

## 2017-02-04 DIAGNOSIS — H35033 Hypertensive retinopathy, bilateral: Secondary | ICD-10-CM | POA: Diagnosis not present

## 2017-04-10 ENCOUNTER — Encounter (INDEPENDENT_AMBULATORY_CARE_PROVIDER_SITE_OTHER): Payer: 59 | Admitting: Ophthalmology

## 2017-04-10 DIAGNOSIS — H34811 Central retinal vein occlusion, right eye, with macular edema: Secondary | ICD-10-CM

## 2017-04-10 DIAGNOSIS — I10 Essential (primary) hypertension: Secondary | ICD-10-CM | POA: Diagnosis not present

## 2017-04-10 DIAGNOSIS — H43813 Vitreous degeneration, bilateral: Secondary | ICD-10-CM

## 2017-04-10 DIAGNOSIS — H2513 Age-related nuclear cataract, bilateral: Secondary | ICD-10-CM | POA: Diagnosis not present

## 2017-04-10 DIAGNOSIS — H35033 Hypertensive retinopathy, bilateral: Secondary | ICD-10-CM

## 2017-06-05 ENCOUNTER — Encounter (INDEPENDENT_AMBULATORY_CARE_PROVIDER_SITE_OTHER): Payer: 59 | Admitting: Ophthalmology

## 2017-06-05 DIAGNOSIS — I10 Essential (primary) hypertension: Secondary | ICD-10-CM

## 2017-06-05 DIAGNOSIS — H43813 Vitreous degeneration, bilateral: Secondary | ICD-10-CM | POA: Diagnosis not present

## 2017-06-05 DIAGNOSIS — H2513 Age-related nuclear cataract, bilateral: Secondary | ICD-10-CM

## 2017-06-05 DIAGNOSIS — H34811 Central retinal vein occlusion, right eye, with macular edema: Secondary | ICD-10-CM

## 2017-06-05 DIAGNOSIS — H35033 Hypertensive retinopathy, bilateral: Secondary | ICD-10-CM | POA: Diagnosis not present

## 2017-07-31 ENCOUNTER — Encounter (INDEPENDENT_AMBULATORY_CARE_PROVIDER_SITE_OTHER): Payer: 59 | Admitting: Ophthalmology

## 2017-07-31 DIAGNOSIS — H35033 Hypertensive retinopathy, bilateral: Secondary | ICD-10-CM | POA: Diagnosis not present

## 2017-07-31 DIAGNOSIS — H43813 Vitreous degeneration, bilateral: Secondary | ICD-10-CM

## 2017-07-31 DIAGNOSIS — H34811 Central retinal vein occlusion, right eye, with macular edema: Secondary | ICD-10-CM | POA: Diagnosis not present

## 2017-07-31 DIAGNOSIS — I10 Essential (primary) hypertension: Secondary | ICD-10-CM

## 2017-07-31 DIAGNOSIS — H2511 Age-related nuclear cataract, right eye: Secondary | ICD-10-CM | POA: Diagnosis not present

## 2017-09-25 ENCOUNTER — Encounter (INDEPENDENT_AMBULATORY_CARE_PROVIDER_SITE_OTHER): Payer: 59 | Admitting: Ophthalmology

## 2017-09-25 DIAGNOSIS — H43813 Vitreous degeneration, bilateral: Secondary | ICD-10-CM | POA: Diagnosis not present

## 2017-09-25 DIAGNOSIS — H35033 Hypertensive retinopathy, bilateral: Secondary | ICD-10-CM

## 2017-09-25 DIAGNOSIS — I10 Essential (primary) hypertension: Secondary | ICD-10-CM | POA: Diagnosis not present

## 2017-09-25 DIAGNOSIS — H34811 Central retinal vein occlusion, right eye, with macular edema: Secondary | ICD-10-CM | POA: Diagnosis not present

## 2017-09-25 DIAGNOSIS — H2513 Age-related nuclear cataract, bilateral: Secondary | ICD-10-CM

## 2017-10-30 ENCOUNTER — Encounter (INDEPENDENT_AMBULATORY_CARE_PROVIDER_SITE_OTHER): Payer: 59 | Admitting: Ophthalmology

## 2017-10-30 DIAGNOSIS — H34811 Central retinal vein occlusion, right eye, with macular edema: Secondary | ICD-10-CM | POA: Diagnosis not present

## 2017-11-20 ENCOUNTER — Encounter (INDEPENDENT_AMBULATORY_CARE_PROVIDER_SITE_OTHER): Payer: 59 | Admitting: Ophthalmology

## 2017-11-20 DIAGNOSIS — H2513 Age-related nuclear cataract, bilateral: Secondary | ICD-10-CM | POA: Diagnosis not present

## 2017-11-20 DIAGNOSIS — H35033 Hypertensive retinopathy, bilateral: Secondary | ICD-10-CM

## 2017-11-20 DIAGNOSIS — H4313 Vitreous hemorrhage, bilateral: Secondary | ICD-10-CM | POA: Diagnosis not present

## 2017-11-20 DIAGNOSIS — I10 Essential (primary) hypertension: Secondary | ICD-10-CM | POA: Diagnosis not present

## 2017-11-20 DIAGNOSIS — H34811 Central retinal vein occlusion, right eye, with macular edema: Secondary | ICD-10-CM | POA: Diagnosis not present

## 2018-01-15 ENCOUNTER — Encounter (INDEPENDENT_AMBULATORY_CARE_PROVIDER_SITE_OTHER): Payer: 59 | Admitting: Ophthalmology

## 2018-01-22 ENCOUNTER — Encounter (INDEPENDENT_AMBULATORY_CARE_PROVIDER_SITE_OTHER): Payer: 59 | Admitting: Ophthalmology

## 2018-01-22 DIAGNOSIS — H2513 Age-related nuclear cataract, bilateral: Secondary | ICD-10-CM

## 2018-01-22 DIAGNOSIS — H35033 Hypertensive retinopathy, bilateral: Secondary | ICD-10-CM | POA: Diagnosis not present

## 2018-01-22 DIAGNOSIS — H34811 Central retinal vein occlusion, right eye, with macular edema: Secondary | ICD-10-CM | POA: Diagnosis not present

## 2018-01-22 DIAGNOSIS — I10 Essential (primary) hypertension: Secondary | ICD-10-CM | POA: Diagnosis not present

## 2018-01-22 DIAGNOSIS — H43813 Vitreous degeneration, bilateral: Secondary | ICD-10-CM | POA: Diagnosis not present

## 2018-04-02 ENCOUNTER — Encounter (INDEPENDENT_AMBULATORY_CARE_PROVIDER_SITE_OTHER): Payer: 59 | Admitting: Ophthalmology

## 2018-04-02 DIAGNOSIS — H43813 Vitreous degeneration, bilateral: Secondary | ICD-10-CM

## 2018-04-02 DIAGNOSIS — H2513 Age-related nuclear cataract, bilateral: Secondary | ICD-10-CM

## 2018-04-02 DIAGNOSIS — H35033 Hypertensive retinopathy, bilateral: Secondary | ICD-10-CM | POA: Diagnosis not present

## 2018-04-02 DIAGNOSIS — H34811 Central retinal vein occlusion, right eye, with macular edema: Secondary | ICD-10-CM | POA: Diagnosis not present

## 2018-04-02 DIAGNOSIS — I10 Essential (primary) hypertension: Secondary | ICD-10-CM | POA: Diagnosis not present

## 2018-06-18 ENCOUNTER — Encounter (INDEPENDENT_AMBULATORY_CARE_PROVIDER_SITE_OTHER): Payer: 59 | Admitting: Ophthalmology

## 2018-06-18 DIAGNOSIS — H34811 Central retinal vein occlusion, right eye, with macular edema: Secondary | ICD-10-CM

## 2018-06-18 DIAGNOSIS — H43813 Vitreous degeneration, bilateral: Secondary | ICD-10-CM

## 2018-06-18 DIAGNOSIS — I10 Essential (primary) hypertension: Secondary | ICD-10-CM | POA: Diagnosis not present

## 2018-06-18 DIAGNOSIS — H2513 Age-related nuclear cataract, bilateral: Secondary | ICD-10-CM

## 2018-06-18 DIAGNOSIS — H35033 Hypertensive retinopathy, bilateral: Secondary | ICD-10-CM

## 2018-06-18 DIAGNOSIS — H35371 Puckering of macula, right eye: Secondary | ICD-10-CM | POA: Diagnosis not present

## 2018-09-03 ENCOUNTER — Encounter (INDEPENDENT_AMBULATORY_CARE_PROVIDER_SITE_OTHER): Payer: 59 | Admitting: Ophthalmology

## 2018-09-03 DIAGNOSIS — H43813 Vitreous degeneration, bilateral: Secondary | ICD-10-CM

## 2018-09-03 DIAGNOSIS — H34811 Central retinal vein occlusion, right eye, with macular edema: Secondary | ICD-10-CM | POA: Diagnosis not present

## 2018-09-03 DIAGNOSIS — H35033 Hypertensive retinopathy, bilateral: Secondary | ICD-10-CM

## 2018-09-03 DIAGNOSIS — I10 Essential (primary) hypertension: Secondary | ICD-10-CM

## 2018-11-19 ENCOUNTER — Other Ambulatory Visit: Payer: Self-pay

## 2018-11-19 ENCOUNTER — Encounter (INDEPENDENT_AMBULATORY_CARE_PROVIDER_SITE_OTHER): Payer: 59 | Admitting: Ophthalmology

## 2018-11-19 DIAGNOSIS — H34811 Central retinal vein occlusion, right eye, with macular edema: Secondary | ICD-10-CM | POA: Diagnosis not present

## 2018-11-19 DIAGNOSIS — H43813 Vitreous degeneration, bilateral: Secondary | ICD-10-CM

## 2018-11-19 DIAGNOSIS — H35033 Hypertensive retinopathy, bilateral: Secondary | ICD-10-CM

## 2018-11-19 DIAGNOSIS — I10 Essential (primary) hypertension: Secondary | ICD-10-CM

## 2018-11-19 DIAGNOSIS — H2511 Age-related nuclear cataract, right eye: Secondary | ICD-10-CM

## 2019-02-04 ENCOUNTER — Other Ambulatory Visit: Payer: Self-pay

## 2019-02-04 ENCOUNTER — Encounter (INDEPENDENT_AMBULATORY_CARE_PROVIDER_SITE_OTHER): Payer: 59 | Admitting: Ophthalmology

## 2019-02-04 DIAGNOSIS — H34811 Central retinal vein occlusion, right eye, with macular edema: Secondary | ICD-10-CM | POA: Diagnosis not present

## 2019-02-04 DIAGNOSIS — H43813 Vitreous degeneration, bilateral: Secondary | ICD-10-CM | POA: Diagnosis not present

## 2019-02-04 DIAGNOSIS — I10 Essential (primary) hypertension: Secondary | ICD-10-CM | POA: Diagnosis not present

## 2019-02-04 DIAGNOSIS — H35033 Hypertensive retinopathy, bilateral: Secondary | ICD-10-CM | POA: Diagnosis not present

## 2019-04-15 ENCOUNTER — Encounter (INDEPENDENT_AMBULATORY_CARE_PROVIDER_SITE_OTHER): Payer: 59 | Admitting: Ophthalmology

## 2019-04-15 ENCOUNTER — Other Ambulatory Visit: Payer: Self-pay

## 2019-04-15 DIAGNOSIS — I10 Essential (primary) hypertension: Secondary | ICD-10-CM

## 2019-04-15 DIAGNOSIS — H43813 Vitreous degeneration, bilateral: Secondary | ICD-10-CM | POA: Diagnosis not present

## 2019-04-15 DIAGNOSIS — H2513 Age-related nuclear cataract, bilateral: Secondary | ICD-10-CM

## 2019-04-15 DIAGNOSIS — H35033 Hypertensive retinopathy, bilateral: Secondary | ICD-10-CM | POA: Diagnosis not present

## 2019-04-15 DIAGNOSIS — H34811 Central retinal vein occlusion, right eye, with macular edema: Secondary | ICD-10-CM

## 2019-06-03 ENCOUNTER — Encounter (INDEPENDENT_AMBULATORY_CARE_PROVIDER_SITE_OTHER): Payer: 59 | Admitting: Ophthalmology

## 2019-06-03 DIAGNOSIS — I10 Essential (primary) hypertension: Secondary | ICD-10-CM | POA: Diagnosis not present

## 2019-06-03 DIAGNOSIS — H35033 Hypertensive retinopathy, bilateral: Secondary | ICD-10-CM | POA: Diagnosis not present

## 2019-06-03 DIAGNOSIS — H43813 Vitreous degeneration, bilateral: Secondary | ICD-10-CM

## 2019-06-03 DIAGNOSIS — H34811 Central retinal vein occlusion, right eye, with macular edema: Secondary | ICD-10-CM

## 2019-07-22 ENCOUNTER — Other Ambulatory Visit: Payer: Self-pay

## 2019-07-22 ENCOUNTER — Encounter (INDEPENDENT_AMBULATORY_CARE_PROVIDER_SITE_OTHER): Payer: 59 | Admitting: Ophthalmology

## 2019-07-22 DIAGNOSIS — H43813 Vitreous degeneration, bilateral: Secondary | ICD-10-CM

## 2019-07-22 DIAGNOSIS — I10 Essential (primary) hypertension: Secondary | ICD-10-CM

## 2019-07-22 DIAGNOSIS — H2513 Age-related nuclear cataract, bilateral: Secondary | ICD-10-CM

## 2019-07-22 DIAGNOSIS — H35033 Hypertensive retinopathy, bilateral: Secondary | ICD-10-CM | POA: Diagnosis not present

## 2019-07-22 DIAGNOSIS — H34811 Central retinal vein occlusion, right eye, with macular edema: Secondary | ICD-10-CM | POA: Diagnosis not present

## 2019-09-23 ENCOUNTER — Encounter (INDEPENDENT_AMBULATORY_CARE_PROVIDER_SITE_OTHER): Payer: 59 | Admitting: Ophthalmology

## 2019-09-23 ENCOUNTER — Other Ambulatory Visit: Payer: Self-pay

## 2019-09-23 DIAGNOSIS — H2513 Age-related nuclear cataract, bilateral: Secondary | ICD-10-CM

## 2019-09-23 DIAGNOSIS — H43813 Vitreous degeneration, bilateral: Secondary | ICD-10-CM | POA: Diagnosis not present

## 2019-09-23 DIAGNOSIS — H34811 Central retinal vein occlusion, right eye, with macular edema: Secondary | ICD-10-CM

## 2019-09-23 DIAGNOSIS — H35033 Hypertensive retinopathy, bilateral: Secondary | ICD-10-CM | POA: Diagnosis not present

## 2019-09-23 DIAGNOSIS — I10 Essential (primary) hypertension: Secondary | ICD-10-CM | POA: Diagnosis not present

## 2019-12-02 ENCOUNTER — Encounter (INDEPENDENT_AMBULATORY_CARE_PROVIDER_SITE_OTHER): Payer: 59 | Admitting: Ophthalmology

## 2019-12-02 DIAGNOSIS — H34811 Central retinal vein occlusion, right eye, with macular edema: Secondary | ICD-10-CM

## 2019-12-02 DIAGNOSIS — I10 Essential (primary) hypertension: Secondary | ICD-10-CM

## 2019-12-02 DIAGNOSIS — H35033 Hypertensive retinopathy, bilateral: Secondary | ICD-10-CM

## 2019-12-02 DIAGNOSIS — H43813 Vitreous degeneration, bilateral: Secondary | ICD-10-CM

## 2020-02-10 ENCOUNTER — Other Ambulatory Visit: Payer: Self-pay

## 2020-02-10 ENCOUNTER — Encounter (INDEPENDENT_AMBULATORY_CARE_PROVIDER_SITE_OTHER): Payer: 59 | Admitting: Ophthalmology

## 2020-02-10 DIAGNOSIS — I10 Essential (primary) hypertension: Secondary | ICD-10-CM

## 2020-02-10 DIAGNOSIS — H43813 Vitreous degeneration, bilateral: Secondary | ICD-10-CM

## 2020-02-10 DIAGNOSIS — H35033 Hypertensive retinopathy, bilateral: Secondary | ICD-10-CM | POA: Diagnosis not present

## 2020-02-10 DIAGNOSIS — H2513 Age-related nuclear cataract, bilateral: Secondary | ICD-10-CM

## 2020-02-10 DIAGNOSIS — H34811 Central retinal vein occlusion, right eye, with macular edema: Secondary | ICD-10-CM | POA: Diagnosis not present

## 2020-04-27 ENCOUNTER — Other Ambulatory Visit: Payer: Self-pay

## 2020-04-27 ENCOUNTER — Encounter (INDEPENDENT_AMBULATORY_CARE_PROVIDER_SITE_OTHER): Payer: 59 | Admitting: Ophthalmology

## 2020-04-27 DIAGNOSIS — I10 Essential (primary) hypertension: Secondary | ICD-10-CM

## 2020-04-27 DIAGNOSIS — H34811 Central retinal vein occlusion, right eye, with macular edema: Secondary | ICD-10-CM

## 2020-04-27 DIAGNOSIS — H35033 Hypertensive retinopathy, bilateral: Secondary | ICD-10-CM

## 2020-04-27 DIAGNOSIS — H43813 Vitreous degeneration, bilateral: Secondary | ICD-10-CM

## 2020-07-06 ENCOUNTER — Encounter (INDEPENDENT_AMBULATORY_CARE_PROVIDER_SITE_OTHER): Payer: 59 | Admitting: Ophthalmology

## 2020-07-06 ENCOUNTER — Other Ambulatory Visit: Payer: Self-pay

## 2020-07-06 DIAGNOSIS — H34811 Central retinal vein occlusion, right eye, with macular edema: Secondary | ICD-10-CM | POA: Diagnosis not present

## 2020-07-06 DIAGNOSIS — I10 Essential (primary) hypertension: Secondary | ICD-10-CM

## 2020-07-06 DIAGNOSIS — H35033 Hypertensive retinopathy, bilateral: Secondary | ICD-10-CM | POA: Diagnosis not present

## 2020-07-06 DIAGNOSIS — H43813 Vitreous degeneration, bilateral: Secondary | ICD-10-CM

## 2020-09-21 ENCOUNTER — Other Ambulatory Visit: Payer: Self-pay

## 2020-09-21 ENCOUNTER — Encounter (INDEPENDENT_AMBULATORY_CARE_PROVIDER_SITE_OTHER): Payer: 59 | Admitting: Ophthalmology

## 2020-09-21 DIAGNOSIS — I10 Essential (primary) hypertension: Secondary | ICD-10-CM

## 2020-09-21 DIAGNOSIS — H43813 Vitreous degeneration, bilateral: Secondary | ICD-10-CM | POA: Diagnosis not present

## 2020-09-21 DIAGNOSIS — H34811 Central retinal vein occlusion, right eye, with macular edema: Secondary | ICD-10-CM | POA: Diagnosis not present

## 2020-09-21 DIAGNOSIS — H35033 Hypertensive retinopathy, bilateral: Secondary | ICD-10-CM | POA: Diagnosis not present

## 2020-11-07 ENCOUNTER — Inpatient Hospital Stay (HOSPITAL_COMMUNITY): Payer: 59

## 2020-11-07 ENCOUNTER — Inpatient Hospital Stay (HOSPITAL_COMMUNITY)
Admission: RE | Disposition: A | Discharge status: Home / Self Care | Payer: Self-pay | Source: Other Acute Inpatient Hospital | Attending: Thoracic Surgery (Cardiothoracic Vascular Surgery)

## 2020-11-07 ENCOUNTER — Inpatient Hospital Stay (HOSPITAL_COMMUNITY)
Admission: RE | Admit: 2020-11-07 | Discharge: 2020-11-14 | DRG: 232 | Disposition: A | Discharge status: Home / Self Care | Payer: 59 | Source: Other Acute Inpatient Hospital | Attending: Thoracic Surgery (Cardiothoracic Vascular Surgery) | Admitting: Thoracic Surgery (Cardiothoracic Vascular Surgery)

## 2020-11-07 ENCOUNTER — Encounter (HOSPITAL_COMMUNITY): Payer: Self-pay | Admitting: Cardiovascular Disease

## 2020-11-07 DIAGNOSIS — Z825 Family history of asthma and other chronic lower respiratory diseases: Secondary | ICD-10-CM

## 2020-11-07 DIAGNOSIS — Z8249 Family history of ischemic heart disease and other diseases of the circulatory system: Secondary | ICD-10-CM

## 2020-11-07 DIAGNOSIS — Z823 Family history of stroke: Secondary | ICD-10-CM | POA: Diagnosis not present

## 2020-11-07 DIAGNOSIS — N400 Enlarged prostate without lower urinary tract symptoms: Secondary | ICD-10-CM | POA: Diagnosis present

## 2020-11-07 DIAGNOSIS — I10 Essential (primary) hypertension: Secondary | ICD-10-CM | POA: Diagnosis present

## 2020-11-07 DIAGNOSIS — I2111 ST elevation (STEMI) myocardial infarction involving right coronary artery: Secondary | ICD-10-CM

## 2020-11-07 DIAGNOSIS — I2119 ST elevation (STEMI) myocardial infarction involving other coronary artery of inferior wall: Secondary | ICD-10-CM | POA: Diagnosis present

## 2020-11-07 DIAGNOSIS — Z951 Presence of aortocoronary bypass graft: Secondary | ICD-10-CM | POA: Diagnosis not present

## 2020-11-07 DIAGNOSIS — J9811 Atelectasis: Secondary | ICD-10-CM | POA: Diagnosis present

## 2020-11-07 DIAGNOSIS — R7303 Prediabetes: Secondary | ICD-10-CM | POA: Diagnosis present

## 2020-11-07 DIAGNOSIS — Z79899 Other long term (current) drug therapy: Secondary | ICD-10-CM | POA: Diagnosis not present

## 2020-11-07 DIAGNOSIS — J90 Pleural effusion, not elsewhere classified: Secondary | ICD-10-CM

## 2020-11-07 DIAGNOSIS — Z6835 Body mass index (BMI) 35.0-35.9, adult: Secondary | ICD-10-CM

## 2020-11-07 DIAGNOSIS — Z83438 Family history of other disorder of lipoprotein metabolism and other lipidemia: Secondary | ICD-10-CM

## 2020-11-07 DIAGNOSIS — I2510 Atherosclerotic heart disease of native coronary artery without angina pectoris: Secondary | ICD-10-CM | POA: Diagnosis present

## 2020-11-07 DIAGNOSIS — D62 Acute posthemorrhagic anemia: Secondary | ICD-10-CM | POA: Diagnosis present

## 2020-11-07 DIAGNOSIS — Z01810 Encounter for preprocedural cardiovascular examination: Secondary | ICD-10-CM | POA: Diagnosis not present

## 2020-11-07 DIAGNOSIS — Z87891 Personal history of nicotine dependence: Secondary | ICD-10-CM

## 2020-11-07 DIAGNOSIS — E669 Obesity, unspecified: Secondary | ICD-10-CM | POA: Diagnosis present

## 2020-11-07 DIAGNOSIS — E785 Hyperlipidemia, unspecified: Secondary | ICD-10-CM | POA: Diagnosis present

## 2020-11-07 DIAGNOSIS — Z20822 Contact with and (suspected) exposure to covid-19: Secondary | ICD-10-CM | POA: Diagnosis present

## 2020-11-07 DIAGNOSIS — I25110 Atherosclerotic heart disease of native coronary artery with unstable angina pectoris: Secondary | ICD-10-CM

## 2020-11-07 DIAGNOSIS — R079 Chest pain, unspecified: Secondary | ICD-10-CM

## 2020-11-07 HISTORY — DX: ST elevation (STEMI) myocardial infarction involving right coronary artery: I21.11

## 2020-11-07 HISTORY — PX: LEFT HEART CATH AND CORONARY ANGIOGRAPHY: CATH118249

## 2020-11-07 HISTORY — PX: CORONARY/GRAFT ACUTE MI REVASCULARIZATION: CATH118305

## 2020-11-07 LAB — RESP PANEL BY RT-PCR (FLU A&B, COVID) ARPGX2
Influenza A by PCR: NEGATIVE
Influenza B by PCR: NEGATIVE
SARS Coronavirus 2 by RT PCR: NEGATIVE

## 2020-11-07 LAB — HEMOGLOBIN A1C
Hgb A1c MFr Bld: 5.5 % (ref 4.8–5.6)
Mean Plasma Glucose: 111.15 mg/dL

## 2020-11-07 LAB — COMPREHENSIVE METABOLIC PANEL
ALT: 29 U/L (ref 0–44)
AST: 32 U/L (ref 15–41)
Albumin: 3.8 g/dL (ref 3.5–5.0)
Alkaline Phosphatase: 62 U/L (ref 38–126)
Anion gap: 9 (ref 5–15)
BUN: 14 mg/dL (ref 6–20)
CO2: 23 mmol/L (ref 22–32)
Calcium: 8.9 mg/dL (ref 8.9–10.3)
Chloride: 102 mmol/L (ref 98–111)
Creatinine, Ser: 1.11 mg/dL (ref 0.61–1.24)
GFR, Estimated: >60 mL/min (ref >60)
Glucose, Bld: 136 mg/dL — ABNORMAL HIGH (ref 70–99)
Potassium: 4.1 mmol/L (ref 3.5–5.1)
Sodium: 134 mmol/L — ABNORMAL LOW (ref 135–145)
Total Bilirubin: 0.5 mg/dL (ref 0.3–1.2)
Total Protein: 6.5 g/dL (ref 6.5–8.1)

## 2020-11-07 LAB — POCT I-STAT, CHEM 8
BUN: 15 mg/dL (ref 6–20)
Calcium, Ion: 1.25 mmol/L (ref 1.15–1.40)
Chloride: 103 mmol/L (ref 98–111)
Creatinine, Ser: 0.9 mg/dL (ref 0.61–1.24)
Glucose, Bld: 139 mg/dL — ABNORMAL HIGH (ref 70–99)
HCT: 40 % (ref 39.0–52.0)
Hemoglobin: 13.6 g/dL (ref 13.0–17.0)
Potassium: 4.3 mmol/L (ref 3.5–5.1)
Sodium: 137 mmol/L (ref 135–145)
TCO2: 23 mmol/L (ref 22–32)

## 2020-11-07 LAB — URINALYSIS, COMPLETE (UACMP) WITH MICROSCOPIC
Bacteria, UA: NONE SEEN
Bilirubin Urine: NEGATIVE
Glucose, UA: NEGATIVE mg/dL
Ketones, ur: NEGATIVE mg/dL
Leukocytes,Ua: NEGATIVE
Nitrite: NEGATIVE
Protein, ur: NEGATIVE mg/dL
Specific Gravity, Urine: 1.014 (ref 1.005–1.030)
pH: 6 (ref 5.0–8.0)

## 2020-11-07 LAB — TROPONIN I (HIGH SENSITIVITY)
Troponin I (High Sensitivity): 741 ng/L (ref <18)
Troponin I (High Sensitivity): 6817 ng/L (ref <18)
Troponin I (High Sensitivity): 12921 ng/L (ref <18)

## 2020-11-07 LAB — PROTIME-INR
INR: 1 (ref 0.8–1.2)
Prothrombin Time: 12.9 seconds (ref 11.4–15.2)

## 2020-11-07 LAB — CBC
HCT: 39.7 % (ref 39.0–52.0)
Hemoglobin: 13.4 g/dL (ref 13.0–17.0)
MCH: 29.8 pg (ref 26.0–34.0)
MCHC: 33.8 g/dL (ref 30.0–36.0)
MCV: 88.4 fL (ref 80.0–100.0)
Platelets: 305 10*3/uL (ref 150–400)
RBC: 4.49 MIL/uL (ref 4.22–5.81)
RDW: 12.3 % (ref 11.5–15.5)
WBC: 9.7 10*3/uL (ref 4.0–10.5)
nRBC: 0 % (ref 0.0–0.2)

## 2020-11-07 LAB — SURGICAL PCR SCREEN
MRSA, PCR: NEGATIVE
Staphylococcus aureus: NEGATIVE

## 2020-11-07 LAB — LIPID PANEL
Cholesterol: 236 mg/dL — ABNORMAL HIGH (ref 0–200)
HDL: 38 mg/dL — ABNORMAL LOW (ref >40)
LDL Cholesterol: 181 mg/dL — ABNORMAL HIGH (ref 0–99)
Total CHOL/HDL Ratio: 6.2 RATIO
Triglycerides: 87 mg/dL (ref <150)
VLDL: 17 mg/dL (ref 0–40)

## 2020-11-07 LAB — ECHOCARDIOGRAM COMPLETE
Area-P 1/2: 3.74 cm2
Calc EF: 42.8 %
Height: 67 in
S' Lateral: 3.6 cm
Single Plane A2C EF: 45.7 %
Single Plane A4C EF: 44.7 %
Weight: 3760 oz

## 2020-11-07 LAB — HIV ANTIBODY (ROUTINE TESTING W REFLEX): HIV Screen 4th Generation wRfx: NONREACTIVE

## 2020-11-07 LAB — APTT: aPTT: 56 seconds — ABNORMAL HIGH (ref 24–36)

## 2020-11-07 LAB — POCT ACTIVATED CLOTTING TIME
Activated Clotting Time: 243 seconds
Activated Clotting Time: 255 seconds

## 2020-11-07 SURGERY — LEFT HEART CATH AND CORONARY ANGIOGRAPHY
Anesthesia: LOCAL

## 2020-11-07 MED ORDER — HEPARIN (PORCINE) IN NACL 1000-0.9 UT/500ML-% IV SOLN
INTRAVENOUS | Status: AC
  Filled 2020-11-07: qty 1500

## 2020-11-07 MED ORDER — ACETAMINOPHEN 325 MG PO TABS: 650.0000 mg | ORAL_TABLET | ORAL | Status: DC | PRN

## 2020-11-07 MED ORDER — IOHEXOL 350 MG/ML SOLN
INTRAVENOUS | Status: AC
  Filled 2020-11-07: qty 1

## 2020-11-07 MED ORDER — TIROFIBAN (AGGRASTAT) BOLUS VIA INFUSION
INTRAVENOUS | Status: DC | PRN
  Administered 2020-11-07: 2665 ug via INTRAVENOUS

## 2020-11-07 MED ORDER — TIROFIBAN HCL IN NACL 5-0.9 MG/100ML-% IV SOLN
INTRAVENOUS | Status: AC | PRN
  Administered 2020-11-07: 0.15 ug/kg/min via INTRAVENOUS

## 2020-11-07 MED ORDER — HEPARIN SODIUM (PORCINE) 1000 UNIT/ML IJ SOLN
INTRAMUSCULAR | Status: DC | PRN
  Administered 2020-11-07: 8000 [IU] via INTRAVENOUS

## 2020-11-07 MED ORDER — ONDANSETRON HCL 4 MG/2ML IJ SOLN: 4.0000 mg | Freq: Four times a day (QID) | INTRAMUSCULAR | Status: DC | PRN

## 2020-11-07 MED ORDER — NITROGLYCERIN 1 MG/10 ML FOR IR/CATH LAB
INTRA_ARTERIAL | Status: DC | PRN
  Administered 2020-11-07: 100 ug via INTRACORONARY
  Administered 2020-11-07: 200 ug via INTRA_ARTERIAL

## 2020-11-07 MED ORDER — FENTANYL CITRATE (PF) 100 MCG/2ML IJ SOLN
INTRAMUSCULAR | Status: DC | PRN
  Administered 2020-11-07 (×2): 25 ug via INTRAVENOUS

## 2020-11-07 MED ORDER — IOHEXOL 350 MG/ML SOLN
INTRAVENOUS | Status: DC | PRN
  Administered 2020-11-07: 100 mL

## 2020-11-07 MED ORDER — LABETALOL HCL 5 MG/ML IV SOLN: 10.0000 mg | INTRAVENOUS | Status: AC | PRN

## 2020-11-07 MED ORDER — MIDAZOLAM HCL 2 MG/2ML IJ SOLN
INTRAMUSCULAR | Status: AC
  Filled 2020-11-07: qty 2

## 2020-11-07 MED ORDER — SODIUM CHLORIDE 0.9 % IV SOLN: 250.0000 mL | INTRAVENOUS | Status: DC | PRN

## 2020-11-07 MED ORDER — MORPHINE SULFATE (PF) 2 MG/ML IV SOLN: 2.0000 mg | INTRAVENOUS | Status: DC | PRN

## 2020-11-07 MED ORDER — OXYCODONE HCL 5 MG PO TABS: 5.0000 mg | ORAL_TABLET | ORAL | Status: DC | PRN

## 2020-11-07 MED ORDER — METOPROLOL TARTRATE 12.5 MG HALF TABLET
12.5000 mg | ORAL_TABLET | Freq: Two times a day (BID) | ORAL | Status: DC
  Administered 2020-11-07 – 2020-11-08 (×4): 12.5 mg via ORAL
  Filled 2020-11-07 (×4): qty 1

## 2020-11-07 MED ORDER — NITROGLYCERIN 0.4 MG SL SUBL: 0.4000 mg | SUBLINGUAL_TABLET | SUBLINGUAL | Status: DC | PRN

## 2020-11-07 MED ORDER — PERFLUTREN LIPID MICROSPHERE
1.0000 mL | INTRAVENOUS | Status: AC | PRN
  Administered 2020-11-07: 2 mL via INTRAVENOUS
  Filled 2020-11-07: qty 10

## 2020-11-07 MED ORDER — HEPARIN SODIUM (PORCINE) 1000 UNIT/ML IJ SOLN
INTRAMUSCULAR | Status: AC
  Filled 2020-11-07: qty 1

## 2020-11-07 MED ORDER — HEPARIN (PORCINE) IN NACL 1000-0.9 UT/500ML-% IV SOLN
INTRAVENOUS | Status: DC | PRN
  Administered 2020-11-07 (×2): 500 mL

## 2020-11-07 MED ORDER — TIROFIBAN HCL IN NACL 5-0.9 MG/100ML-% IV SOLN
INTRAVENOUS | Status: AC
  Filled 2020-11-07: qty 100

## 2020-11-07 MED ORDER — LIDOCAINE HCL (PF) 1 % IJ SOLN
INTRAMUSCULAR | Status: DC | PRN
  Administered 2020-11-07: 2 mL

## 2020-11-07 MED ORDER — SODIUM CHLORIDE 0.9% FLUSH
3.0000 mL | INTRAVENOUS | Status: DC | PRN
  Administered 2020-11-08: 3 mL via INTRAVENOUS

## 2020-11-07 MED ORDER — CHLORHEXIDINE GLUCONATE CLOTH 2 % EX PADS
6.0000 | MEDICATED_PAD | Freq: Every day | CUTANEOUS | Status: DC
  Administered 2020-11-07 – 2020-11-08 (×2): 6 via TOPICAL

## 2020-11-07 MED ORDER — VERAPAMIL HCL 2.5 MG/ML IV SOLN
INTRAVENOUS | Status: DC | PRN
  Administered 2020-11-07: 10 mL via INTRA_ARTERIAL

## 2020-11-07 MED ORDER — SODIUM CHLORIDE 0.9% FLUSH
3.0000 mL | Freq: Two times a day (BID) | INTRAVENOUS | Status: DC
  Administered 2020-11-07 – 2020-11-08 (×3): 3 mL via INTRAVENOUS

## 2020-11-07 MED ORDER — HYDRALAZINE HCL 20 MG/ML IJ SOLN: 10.0000 mg | INTRAMUSCULAR | Status: AC | PRN

## 2020-11-07 MED ORDER — SODIUM CHLORIDE 0.9 % WEIGHT BASED INFUSION
1.0000 mL/kg/h | INTRAVENOUS | Status: AC
  Administered 2020-11-07: 1 mL/kg/h via INTRAVENOUS

## 2020-11-07 MED ORDER — VERAPAMIL HCL 2.5 MG/ML IV SOLN
INTRAVENOUS | Status: AC
  Filled 2020-11-07: qty 2

## 2020-11-07 MED ORDER — ATORVASTATIN CALCIUM 80 MG PO TABS
80.0000 mg | ORAL_TABLET | Freq: Every day | ORAL | Status: DC
  Administered 2020-11-07 – 2020-11-08 (×2): 80 mg via ORAL
  Filled 2020-11-07 (×2): qty 1

## 2020-11-07 MED ORDER — ASPIRIN EC 81 MG PO TBEC
81.0000 mg | DELAYED_RELEASE_TABLET | Freq: Every day | ORAL | Status: DC
  Administered 2020-11-08: 81 mg via ORAL
  Filled 2020-11-07: qty 1

## 2020-11-07 MED ORDER — SODIUM CHLORIDE 0.9 % IV SOLN
INTRAVENOUS | Status: AC | PRN
  Administered 2020-11-07: 100 mL/h via INTRAVENOUS

## 2020-11-07 MED ORDER — MIDAZOLAM HCL 2 MG/2ML IJ SOLN
INTRAMUSCULAR | Status: DC | PRN
  Administered 2020-11-07 (×2): 2 mg via INTRAVENOUS

## 2020-11-07 MED ORDER — LIDOCAINE HCL (PF) 1 % IJ SOLN
INTRAMUSCULAR | Status: AC
  Filled 2020-11-07: qty 30

## 2020-11-07 MED ORDER — MUPIROCIN 2 % EX OINT
1.0000 "application " | TOPICAL_OINTMENT | Freq: Two times a day (BID) | CUTANEOUS | Status: DC
  Administered 2020-11-07 – 2020-11-08 (×4): 1 via NASAL
  Filled 2020-11-07: qty 22

## 2020-11-07 MED ORDER — FENTANYL CITRATE (PF) 100 MCG/2ML IJ SOLN
INTRAMUSCULAR | Status: AC
  Filled 2020-11-07: qty 2

## 2020-11-07 MED ORDER — TIROFIBAN HCL IN NACL 5-0.9 MG/100ML-% IV SOLN
0.1500 ug/kg/min | INTRAVENOUS | Status: DC
  Administered 2020-11-07 – 2020-11-08 (×7): 0.15 ug/kg/min via INTRAVENOUS
  Filled 2020-11-07 (×8): qty 100

## 2020-11-07 SURGICAL SUPPLY — 16 items
BALLN SAPPHIRE 3.0X12 (BALLOONS) ×2
BALLOON SAPPHIRE 3.0X12 (BALLOONS) IMPLANT
CATH 5FR JL3.5 JR4 ANG PIG MP (CATHETERS) ×1 IMPLANT
CATH LAUNCHER 5F JR4 (CATHETERS) ×1 IMPLANT
CATH VISTA GUIDE 6FR JR4 (CATHETERS) ×1 IMPLANT
DEVICE RAD COMP TR BAND LRG (VASCULAR PRODUCTS) ×1 IMPLANT
GLIDESHEATH SLEND SS 6F .021 (SHEATH) ×1 IMPLANT
GUIDEWIRE INQWIRE 1.5J.035X260 (WIRE) IMPLANT
INQWIRE 1.5J .035X260CM (WIRE) ×2
KIT ENCORE 26 ADVANTAGE (KITS) ×1 IMPLANT
KIT HEART LEFT (KITS) ×2 IMPLANT
PACK CARDIAC CATHETERIZATION (CUSTOM PROCEDURE TRAY) ×2 IMPLANT
TRANSDUCER W/STOPCOCK (MISCELLANEOUS) ×2 IMPLANT
TUBING CIL FLEX 10 FLL-RA (TUBING) ×2 IMPLANT
WIRE COUGAR XT STRL 190CM (WIRE) ×1 IMPLANT
WIRE HI TORQ VERSACORE-J 145CM (WIRE) ×1 IMPLANT

## 2020-11-07 NOTE — Progress Notes (Signed)
  Echocardiogram 2D Echocardiogram has been performed.  Janalyn Harder 11/07/2020, 10:59 AM

## 2020-11-07 NOTE — Progress Notes (Signed)
   Digital images reviewed  EKG tracings are not present in epic.  No current discomfort.  Agree with plan for complete revascularization with coronary bypass grafting within the next 24 to 48 hours

## 2020-11-07 NOTE — Progress Notes (Signed)
ANTICOAGULATION CONSULT NOTE - Initial Consult  Pharmacy Consult for tirofiban Indication: atrial fibrillation  No Known Allergies  Patient Measurements: Height: 5\' 7"  (170.2 cm) Weight: 106.6 kg (235 lb) IBW/kg (Calculated) : 66.1  Vital Signs:    Labs: Recent Labs    11/07/20 0717 11/07/20 0720  HGB 13.6 13.4  HCT 40.0 39.7  PLT  --  305  APTT  --  56*  LABPROT  --  12.9  INR  --  1.0  CREATININE 0.90 1.11  TROPONINIHS  --  741*    Estimated Creatinine Clearance: 84.4 mL/min (by C-G formula based on SCr of 1.11 mg/dL).   Medical History: Past Medical History:  Diagnosis Date  . Central retinal vein occlusion of right eye 03/18/2013   05/18/2013  . ED (erectile dysfunction)   . Hyperlipidemia   . Hypertension      Assessment: 67 yoM admitted with STEMI s/p POBA with plans for CABG after angioplasty only performed in cath lab.  Plan:  Tirofiban 0.15 mcg/kg/min until surgery Will need to stop tirofiban ~4h pre-op to minimize bleed risk   41, PharmD, BCPS, [facility] Clinical Pharmacist [phone] Please check AMION for all [facility] Pharm[facility] numbers 11/07/2020

## 2020-11-07 NOTE — H&P (Signed)
Cardiology Admission History and Physical:   Patient ID: Jorge Molina. MRN: [id number]; DOB: [birth year]   Admission date: 11/07/2020  PCP:  Patient, No Pcp Per   [facility]  Cardiologist:  No primary care provider on file.  Advanced Practice Provider:  No care team member to display Electrophysiologist:  None       Chief Complaint:  Chest Pain  Patient Profile:   Jorge Molina. is a 61 y.o. male with inferolateral STEMI, presenting in transfer from [facility].  History of Present Illness:   Jorge Molina has no past cardiac history.  He does have a history of untreated hypertension and hyperlipidemia.  He has not taken any medications in several years, nor has he seen a primary care physician in about 6 years.  The patient works in [employer].  He woke up this morning at 4 AM with chest pain.  He describes a tingling and pressure-like sensation in the center of his chest.  He laid back down, hoping the pain would go away, but it persisted.  He drove himself to [facility] emergency room and an EKG there demonstrated inferolateral ST elevation.  A code STEMI is called and the patient is transferred emergently for cardiac catheterization and possible PCI.  The patient has noted over the past few weeks that when he walks to his mailbox he has chest discomfort.  Symptoms resolve with rest.  He states that it is about 1/4 mile walk.  There has been associated dyspnea.  No nausea, vomiting, or diaphoresis.  No palpitations, lightheadedness, or syncope.  The patient is a remote smoker.  He has not smoked in many years.  He drinks 2 glasses of wine every night.  No illicit drug use.  No personal history of diabetes.  The patient's father had multivessel CABG at age 47.   Past Medical History:  Diagnosis Date  . Central retinal vein occlusion of right eye 03/18/2013   Jorge Molina  . ED (erectile dysfunction)   . Hyperlipidemia   .  Hypertension     Past Surgical History:  Procedure Laterality Date  . TONSILLECTOMY       Medications Prior to Admission: Prior to Admission medications   Medication Sig Start Date End Date Taking? Authorizing Provider  atorvastatin (LIPITOR) 20 MG tablet Take 1 tablet (20 mg total) by mouth daily. NO MORE REFILLS WITHOUT OFFICE VISIT - 2ND NOTICE 01/22/15   Ethelda Chick, MD  lisinopril (PRINIVIL,ZESTRIL) 5 MG tablet Take 1 tablet (5 mg total) by mouth daily. NO MORE REFILLS WITHOUT OFFICE VISIT - 2ND NOTICE 11/27/14   Ethelda Chick, MD  Multiple Vitamin (MULTIVITAMIN) tablet Take 1 tablet by mouth daily.    [provider]     Allergies:   No Known Allergies  Social History:   Social History   Socioeconomic History  . Marital status: Single    Spouse name: Not on file  . Number of children: Not on file  . Years of education: Not on file  . Highest education level: Not on file  Occupational History  . Not on file  Tobacco Use  . Smoking status: Former Smoker    Years: 20.00    Types: Cigars    Quit date: 08/18/2000    Years since quitting: 20.2  . Smokeless tobacco: Not on file  . Tobacco comment: smoked on/off  Substance and Sexual Activity  . Alcohol use: Yes    Alcohol/week: 10.0 standard drinks  Types: 10 Glasses of wine per week    Comment: beer and wine  . Drug use: No  . Sexual activity: Yes  Other Topics Concern  . Not on file  Social History Narrative   Marital status: single; dating.      Children: none      Employment:  Insurance account manager       Tobacco: none       Alcohol:  1-2 beers and wine per day.  No DWIs.      Drugs: none       Exercise: none      Seatbelt:  100%      Guns:  Loaded guns.        Sexual activity: sexually active; no STDs; last STD screening 07/2013.  Total sexual partners = 40s.    75% of time uses condoms.  Females.          Social Determinants of Health   Financial Resource Strain: Not  on file  Food Insecurity: Not on file  Transportation Needs: Not on file  Physical Activity: Not on file  Stress: Not on file  Social Connections: Not on file  Intimate Partner Violence: Not on file    Family History:   The patient's family history includes Asthma in his sister; Cancer in his paternal grandfather and paternal grandmother; Heart disease in his maternal grandmother; Heart disease (age of onset: 87) in his father; Hyperlipidemia in his father; Hypertension in his father, maternal grandmother, mother, and paternal uncle; Mental illness in his father; Stroke in his father, maternal grandmother, and paternal uncle.    ROS:  Please see the history of present illness.  All other ROS reviewed and negative.     Physical Exam/Data:   Vitals:   11/07/20 0705  SpO2: 100%  Weight: 106.6 kg   No intake or output data in the 24 hours ending 11/07/20 0840 Last 3 Weights 11/07/2020 01/18/2014 09/19/2013  Weight (lbs) 235 lb 210 lb 211 lb 6.4 oz  Weight (kg) 106.595 kg 95.255 kg 95.89 kg     Body mass index is 38.51 kg/m.  General:  Well nourished, well developed, obese male in no acute distress HEENT: normal Lymph: no adenopathy Neck: no JVD Endocrine:  No thryomegaly Vascular: No carotid bruits; FA pulses 2+ bilaterally without bruits  Cardiac:  normal S1, S2; RRR; no murmur  Lungs:  clear to auscultation bilaterally, no wheezing, rhonchi or rales  Abd: soft, nontender, no hepatomegaly  Ext: no edema Musculoskeletal:  No deformities, BUE and BLE strength normal and equal Skin: warm and dry  Neuro:  CNs 2-12 intact, no focal abnormalities noted Psych:  Normal affect    EKG:  The ECG that was done was personally reviewed and demonstrates normal sinus rhythm, acute inferolateral STEMI  Relevant CV Studies: Pending  Laboratory Data:  High Sensitivity Troponin:   Recent Labs  Lab 11/07/20 0720  TROPONINIHS 741*      Chemistry Recent Labs  Lab 11/07/20 0720  NA 134*   K 4.1  CL 102  CO2 23  GLUCOSE 136*  BUN 14  CREATININE 1.11  CALCIUM 8.9  GFRNONAA >60  ANIONGAP 9    Recent Labs  Lab 11/07/20 0720  PROT 6.5  ALBUMIN 3.8  AST 32  ALT 29  ALKPHOS 62  BILITOT 0.5   Hematology Recent Labs  Lab 11/07/20 0720  WBC 9.7  RBC 4.49  HGB 13.4  HCT 39.7  MCV 88.4  MCH 29.8  MCHC 33.8  RDW 12.3  PLT 305   BNPNo results for input(s): BNP, PROBNP in the last 168 hours.  DDimer No results for input(s): DDIMER in the last 168 hours.   Radiology/Studies:  No results found.   Assessment and Plan:   1. Acute STEMI of the inferolateral wall: Patient has received clopidogrel 600 mg at [facility].  He has received 5000 units of unfractionated heparin and 324 mg of aspirin.  We will proceed emergently with cardiac catheterization and possible PCI.  Emergency implied consent is obtained.  I explained the procedure, its potential risks, and benefits to the patient.  He clearly expresses understanding and agrees to proceed. 2. Hypertension: Untreated, no medical care in several years.  Will follow blood pressure and initiate treatment likely with beta-blocker and ACE/ARB. 3. Suspected hyperlipidemia: Check lipid panel, start high intensity statin drug.  Disposition: Pending cardiac catheterization results.   Risk Assessment/Risk Scores:     TIMI Risk Score for ST  Elevation MI:   The patient's TIMI risk score is 2, which indicates a 2.2% risk of all cause mortality at 30 days.    Severity of Illness: The appropriate patient status for this patient is INPATIENT. Inpatient status is judged to be reasonable and necessary in order to provide the required intensity of service to ensure the patient's safety. The patient's presenting symptoms, physical exam findings, and initial radiographic and laboratory data in the context of their chronic comorbidities is felt to place them at high risk for further clinical deterioration. Furthermore, it is  not anticipated that the patient will be medically stable for discharge from the hospital within 2 midnights of admission.   For questions or updates, please contact [facility] Please consult [email or web address] for contact info under    Signed, Tonny Bollman, MD  11/07/2020 8:40 AM

## 2020-11-07 NOTE — Consult Note (Addendum)
[address]       [place] [address]             [place]. [place] Record #[id number] Date of Birth: [birth year]  Referring: Sherren Mocha, MD Primary Care: Patient, No Pcp  Primary Cardiologist: Sherren Mocha, MD  Chief Complaint:   Chest pain.   History of Present Illness:     Jorge Molina is a 60 year old male with past history of dyslipidemia, untreated hypertension, obesity, benign prostatic hyperplasia, and remote history of tobacco use having quit about 20 years ago.  He was in his usual state of good health until a few weeks ago when he noticed having some chest tightness with exertion that resolved with rest.  He was awakened at about 4 AM this morning with what he described as "tingling" in his chest with associated pressure.  This discomfort persisted so he drove himself to the emergency room at [facility].  Initial EKG at that facility demonstrated ST elevations in inferior leads.  Code STEMI was initiated, he was given a loading dose of Plavix 600 mg, and the patient was transferred to [facility] ED.   Initial high-sensitivity troponin was 741 and later rose to in excess of 6800.  He was taken to Cath Lab emergently.  Left heart catheterization demonstrated a 70% mid to distal left main coronary stenosis.  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  The culprit lesion was the distal right coronary artery with a 95% stenosis.  Angioplasty of this lesion reduced it to a 35% stenosis.  Ejection fraction was estimated at 45% with inferior hypokinesis. Jorge Molina is currently pain-free.  He has been admitted to the hospital and started on IV tirofiban. An echocardiogram has been performed but results are pending at the time of this dictation. We have been asked to evaluate Jorge Molina for consideration of coronary bypass grafting for his residual coronary  disease.   Current Activity/ Functional Status:    Zubrod Score: At the time of surgery this patient's most appropriate activity status/level should be described as: '[]'     0    Normal activity, no symptoms '[x]'     1    Restricted in physical strenuous activity but ambulatory, able to do out light work '[]'     2    Ambulatory and capable of self care, unable to do work activities, up and about                 more than 50%  Of the time                            '[]'     3    Only limited self care, in bed greater than 50% of waking hours '[]'     4    Completely disabled, no self care, confined to bed or chair '[]'     5    Moribund  Past Medical History:  Diagnosis Date  . Central retinal vein occlusion of right eye 03/18/2013   Zigmund Daniel  . ED (erectile dysfunction)   . Hyperlipidemia   . Hypertension     Past Surgical History:  Procedure Laterality Date  . CORONARY/GRAFT ACUTE MI REVASCULARIZATION N/A 11/07/2020   Procedure: Coronary/Graft Acute MI Revascularization;  Surgeon: Sherren Mocha, MD;  Location: [facility];  Service: Cardiovascular;  Laterality: N/A;  . LEFT HEART CATH AND CORONARY ANGIOGRAPHY N/A 11/07/2020   Procedure: LEFT HEART CATH AND CORONARY ANGIOGRAPHY;  Surgeon: Sherren Mocha, MD;  Location: [facility];  Service: Cardiovascular;  Laterality: N/A;  . TONSILLECTOMY      Social History   Tobacco Use  Smoking Status Former Smoker  . Years: 20.00  . Types: Cigars  . Quit date: 08/18/2000  . Years since quitting: 20.2  Smokeless Tobacco Not on file  Tobacco Comment   smoked on/off    Social History   Substance and Sexual Activity  Alcohol Use Yes  . Alcohol/week: 10.0 standard drinks  . Types: 10 Glasses of wine per week   Comment: beer and wine     No Known Allergies  Current Facility-Administered Medications  Medication Dose Route Frequency Provider Last Rate Last Admin  . 0.9 %  sodium chloride infusion  250 mL Intravenous PRN Sherren Mocha, MD      . 0.9% sodium chloride infusion  1 mL/kg/hr Intravenous Continuous Sherren Mocha, MD 106.6 mL/hr at 11/07/20 0900 1 mL/kg/hr at 11/07/20 0900  . acetaminophen (TYLENOL) tablet 650 mg  650 mg Oral Q4H PRN Sherren Mocha, MD      . Derrill Memo ON 11/08/2020] aspirin EC tablet 81 mg  81 mg Oral Daily Sherren Mocha, MD      . atorvastatin (LIPITOR) tablet 80 mg  80 mg Oral Daily Sherren Mocha, MD   80 mg at 11/07/20 1607  . hydrALAZINE (APRESOLINE) injection 10 mg  10 mg Intravenous Q20 Min PRN Sherren Mocha, MD      . labetalol (NORMODYNE) injection 10 mg  10 mg Intravenous Q10 min PRN Sherren Mocha, MD      . metoprolol tartrate (LOPRESSOR) tablet 12.5 mg  12.5 mg Oral BID Sherren Mocha, MD   12.5 mg at 11/07/20 3710  . morphine 2 MG/ML injection 2 mg  2 mg Intravenous Q1H PRN Sherren Mocha, MD      . mupirocin ointment (BACTROBAN) 2 % 1 application  1 application Nasal BID Rexene Alberts, MD      . nitroGLYCERIN (NITROSTAT) SL tablet 0.4 mg  0.4 mg Sublingual Q5 Min x 3 PRN Sherren Mocha, MD      . ondansetron [facility]) injection 4 mg  4 mg Intravenous Q6H PRN Sherren Mocha, MD      . oxyCODONE (Oxy IR/ROXICODONE) immediate release tablet 5-10 mg  5-10 mg Oral Q4H PRN Sherren Mocha, MD      . perflutren lipid microspheres (DEFINITY) IV suspension  1-10 mL Intravenous PRN Rexene Alberts, MD   2 mL at 11/07/20 1058  . sodium chloride flush (NS) 0.9 % injection 3 mL  3 mL Intravenous Q12H Sherren Mocha, MD      . sodium chloride flush (NS) 0.9 % injection 3 mL  3 mL Intravenous PRN Sherren Mocha, MD      . tirofiban (AGGRASTAT) infusion 50 mcg/mL 100 mL  0.15 mcg/kg/min Intravenous Continuous Einar Grad, [facility]   Paused at 11/07/20 6269    Medications Prior to Admission  Medication Sig Dispense Refill Last Dose  . aspirin EC 81 MG tablet Take 81 mg by mouth daily. Swallow whole.   11/06/2020 at Unknown time  . Melatonin 10 MG TABS Take 10 mg by mouth at  bedtime as needed (sleep).   11/06/2020 at Unknown time  . VITAMIN D PO Take 1 capsule by mouth daily.   11/06/2020 at  Unknown time  . atorvastatin (LIPITOR) 20 MG tablet Take 1 tablet (20 mg total) by mouth daily. NO MORE REFILLS WITHOUT OFFICE VISIT - 2ND NOTICE (Patient not taking: Reported on 11/07/2020) 15 tablet 0 Not Taking at Unknown time  . lisinopril (PRINIVIL,ZESTRIL) 5 MG tablet Take 1 tablet (5 mg total) by mouth daily. NO MORE REFILLS WITHOUT OFFICE VISIT - 2ND NOTICE (Patient not taking: Reported on 11/07/2020) 15 tablet 0 Not Taking at Unknown time    Family History  Problem Relation Age of Onset  . Hypertension Mother   . Heart disease Father 63       CABG  . Stroke Father        multiple TIAs  . Mental illness Father   . Hyperlipidemia Father   . Hypertension Father   . Hypertension Paternal Uncle   . Stroke Paternal Uncle   . Heart disease Maternal Grandmother   . Hypertension Maternal Grandmother   . Stroke Maternal Grandmother   . Cancer Paternal Grandmother   . Cancer Paternal Grandfather   . Asthma Sister      Review of Systems:   ROS    Cardiac Review of Systems: Y or  [    ]= no  Chest Pain [  x  ]  Resting SOB [  x ] Exertional SOB  [ x ]  Orthopnea [  ]   Pedal Edema [   ]    Palpitations [  ] Syncope  [  ]   Presyncope [   ]  General Review of Systems: [Y] = yes [  ]=no Constitional: recent weight change [  ]; anorexia [  ]; fatigue [  ]; nausea [  ]; night sweats [  ]; fever [  ]; or chills [  ]                                                               Dental: Last Dentist visit:   Eye : blurred vision [  ]; diplopia [   ]; vision changes [  ];  Amaurosis fugax[  ]; Resp: cough [  ];  wheezing[  ];  hemoptysis[  ]; shortness of breath[  ]; paroxysmal nocturnal dyspnea[  ]; dyspnea on exertion[  ]; or orthopnea[  ];  GI:  gallstones[  ], vomiting[  ];  dysphagia[  ]; melena[  ];  hematochezia [  ]; heartburn[  ];   Hx of  Colonoscopy[  ]; GU: kidney  stones [  ]; hematuria[  ];   dysuria [  ];  nocturia[  ];  history of     obstruction [  ]; urinary frequency [  ]             Skin: rash, swelling[  ];, hair loss[  ];  peripheral edema[  ];  or itching[  ]; Musculosketetal: myalgias[  ];  joint swelling[  ];  joint erythema[  ];  joint pain[  ];  back pain[  ];  Heme/Lymph: bruising[  ];  bleeding[  ];  anemia[  ];  Neuro: TIA[  ];  headaches[  ];  stroke[  ];  vertigo[  ];  seizures[  ];   paresthesias[  ];  difficulty walking[  ];  Psych:depression[  ];  anxiety[  ];  Endocrine: diabetes[  ];  thyroid dysfunction[  ]  Physical Exam: BP 132/81   Pulse 75   Temp 97.9 F (36.6 C) (Oral)   Resp 20   Ht '5\' 7"'  (1.702 m)   Wt 106.6 kg   SpO2 96%   BMI 36.81 kg/m    General appearance: alert, cooperative and no distress Head: Normocephalic, without obvious abnormality, atraumatic Neck: no adenopathy, no carotid bruit, no JVD and supple, symmetrical, trachea midline Resp: clear to auscultation bilaterally Cardio: regular rate and rhythm, S1, S2 normal, no murmur, click, rub or gallop GI: Soft, non-tender, normal bowel sounds Extremities: all well perfused with palpable distal pulses, no obvious varicosities.  The bilateral Allen's tests demonstrate brisk capillary refill in the palms with release of ulnar artery pressure.  Neurologic: Grossly normal  Diagnostic Studies & Laboratory data:     Recent Radiology Findings:    Coronary/Graft Acute MI Revascularization  LEFT HEART CATH AND CORONARY ANGIOGRAPHY    Conclusion    Prox RCA to Mid RCA lesion is 40% stenosed.  Dist RCA lesion is 95% stenosed.  Mid LM to Dist LM lesion is 70% stenosed.  Mid LAD lesion is 100% stenosed.  1st Mrg lesion is 65% stenosed.  There is mild to moderate left ventricular systolic dysfunction.  LV end diastolic pressure is normal.  The left ventricular ejection fraction is 45-50% by visual estimate.  Balloon angioplasty was performed  using a BALLOON SAPPHIRE 3.0X12.  Post intervention, there is a 35% residual stenosis.   1.  Severe mid to distal left main stenosis 2.  Chronic total occlusion of the LAD just after the first diagonal branch with the mid and distal vessel filling late via left to left collaterals 3.  Moderate mid circumflex stenosis into a large caliber first obtuse marginal vessel 4.  Critical stenosis of the distal RCA (culprit lesion), treated successfully with balloon angioplasty using a 3.0 x 12 mm balloon 5.  Mild to moderate segmental left ventricular systolic dysfunction with anterolateral hypokinesis and basal inferior hypokinesis of the LV, estimated LVEF 45%  Recommendations: Because of the patient's multivessel disease with left main stenosis, I reviewed the patient's cardiac catheterization films with Dr. Roxy Manns.  We discussed treatment options and agreed that primary PCI to stabilize the patient's distal RCA is appropriate after this patient was loaded with 600 mg clopidogrel this morning.  He has surgical coronary anatomy, but will have reduced bleeding risk once Plavix washout occurs.  Other lesions appear stable/chronic based on angiographic features.  Will initiate IV tirofiban and patient will have cardiac surgical consultation for consideration of multivessel CABG during his index hospitalization.   Indications  ST elevation myocardial infarction involving right coronary artery ([place]) [I21.11 (ICD-10-CM)]   Procedural Details  Technical Details INDICATION: 60 year old gentleman presenting in transfer from [facility], no past cardiac history, acute inferolateral STEMI, chest pain onset 0400 today.  Patient received IV heparin, aspirin 324 mg, clopidogrel 600 mg at [facility] emergency department.  PROCEDURAL DETAILS: The right wrist is prepped, draped, and anesthetized with 1% lidocaine. Using the modified Seldinger technique, a 5/6 French Slender sheath is introduced into the right  radial artery. 3 mg of verapamil is administered through the sheath, weight-based unfractionated heparin was administered intravenously. Standard Judkins catheters are used for selective coronary angiography. LV pressure is recorded and an aortic valve pullback gradient is measured.  Left ventriculography is performed.  PCI of the RCA is performed with balloon angioplasty.  Therapeutic ACT is achieved.  Catheter exchanges are performed over an exchange length guidewire. There are no immediate procedural complications. A TR band is used for radial hemostasis at the completion of the procedure.  The patient was transferred to the post catheterization recovery area for further monitoring.    Estimated blood loss <50 mL.   During this procedure medications were administered to achieve and maintain moderate conscious sedation while the patient's heart rate, blood pressure, and oxygen saturation were continuously monitored and I was present face-to-face 100% of this time.   Medications (Filter: Administrations occurring from 0658 to 0800 on 11/07/20) (important) Continuous medications are totaled by the amount administered until 11/07/20 0800.    midazolam (VERSED) injection (mg) Total dose:  4 mg  Date/Time Rate/Dose/Volume Action   11/07/20 0704 2 mg Given   0733 2 mg Given    fentaNYL (SUBLIMAZE) injection (mcg) Total dose:  50 mcg  Date/Time Rate/Dose/Volume Action   11/07/20 0704 25 mcg Given   0733 25 mcg Given    0.9 % sodium chloride infusion (mL/hr) Total dose:  Cannot be calculated*  *Continuous medication not stopped within the calculation time range. Date/Time Rate/Dose/Volume Action   11/07/20 0705 100 mL/hr New Bag/Given    lidocaine (PF) (XYLOCAINE) 1 % injection (mL) Total volume:  2 mL  Date/Time Rate/Dose/Volume Action   11/07/20 0712 2 mL Given    Radial Cocktail/Verapamil only (mL) Total volume:  10 mL  Date/Time Rate/Dose/Volume Action   11/07/20 0713 10  mL Given    heparin sodium (porcine) injection (Units) Total dose:  8,000 Units  Date/Time Rate/Dose/Volume Action   11/07/20 0716 8,000 Units Given    Heparin (Porcine) in NaCl 1000-0.9 UT/500ML-% SOLN (mL) Total volume:  1,000 mL  Date/Time Rate/Dose/Volume Action   11/07/20 0731 500 mL Given   0800 500 mL Given    iohexol (OMNIPAQUE) 350 MG/ML injection (mL) Total volume:  100 mL  Date/Time Rate/Dose/Volume Action   11/07/20  Canceled Entry   0757 100 mL Given    nitroGLYCERIN 1 mg/10 mL (100 mcg/mL) - IR/CATH LAB (mcg) Total dose:  300 mcg  Date/Time Rate/Dose/Volume Action   11/07/20 0734 200 mcg Given   0744 100 mcg Given    tirofiban (AGGRASTAT) bolus via infusion (mcg/kg) Total dose:  2,665 mcg Dosing weight:  106.6  Date/Time Rate/Dose/Volume Action   11/07/20 0742 2,665 mcg Given    tirofiban (AGGRASTAT) infusion 50 mcg/mL 100 mL (mcg/kg/min) Total dose:  Cannot be calculated* Dosing weight:  106.6  *Continuous medication not stopped within the calculation time range. Date/Time Rate/Dose/Volume Action   11/07/20 0747 0.15 mcg/kg/min - 19.19 mL/hr New Bag/Given    Sedation Time  Sedation Time Physician-1: 44 minutes 53 seconds   Contrast  Medication Name Total Dose  iohexol (OMNIPAQUE) 350 MG/ML injection 100 mL    Radiation/Fluoro  Fluoro time: 5.6 (min) DAP: 36586 (mGycm2) Cumulative Air Kerma: 341 (mGy)   Coronary Findings   Diagnostic Dominance: Right  Left Main  Mid LM to Dist LM lesion is 70% stenosed.  Left Anterior Descending  Collaterals  Mid LAD filled by collaterals from 1st Diag.    Mid LAD lesion is 100% stenosed. The lesion is calcified. Angiographic appearance of chronic total occlusion. Mid and distal LAD fill late and appear to be suitable targets for grafting  Left Circumflex  First Obtuse Marginal Branch  1st Mrg lesion is 65% stenosed. The lesion is focal.  Right Coronary Artery  Prox RCA to  Mid RCA lesion is  40% stenosed. Diffuse nonobstructive plaquing noted  Dist RCA lesion is 95% stenosed. The lesion is eccentric.   Intervention   Dist RCA lesion  Angioplasty  CATH LAUNCHER 41F JR4 guide catheter was inserted. WIRE COUGAR XT STRL 190CM guidewire used to cross lesion. Balloon angioplasty was performed using a BALLOON SAPPHIRE 3.0X12. Maximum pressure: 6 atm. Inflation time: 90 sec.  Post-Intervention Lesion Assessment  The intervention was successful. Pre-interventional TIMI flow is 3. Post-intervention TIMI flow is 3. No complications occurred at this lesion.  There is a 35% residual stenosis post intervention.   Wall Motion  Resting                Left Heart  Left Ventricle The left ventricular size is normal. There is mild to moderate left ventricular systolic dysfunction. LV end diastolic pressure is normal. The left ventricular ejection fraction is 45-50% by visual estimate. There are LV function abnormalities due to segmental dysfunction.   Coronary Diagrams   Diagnostic Dominance: Right    Intervention       I have independently reviewed the above radiologic studies and discussed with the patient   Recent Lab Findings: Lab Results  Component Value Date   WBC 9.7 11/07/2020   HGB 13.4 11/07/2020   HCT 39.7 11/07/2020   PLT 305 11/07/2020   GLUCOSE 136 (H) 11/07/2020   CHOL 236 (H) 11/07/2020   TRIG 87 11/07/2020   HDL 38 (L) 11/07/2020   LDLCALC 181 (H) 11/07/2020   ALT 29 11/07/2020   AST 32 11/07/2020   NA 134 (L) 11/07/2020   K 4.1 11/07/2020   CL 102 11/07/2020   CREATININE 1.11 11/07/2020   BUN 14 11/07/2020   CO2 23 11/07/2020   TSH 2.098 08/04/2013   INR 1.0 11/07/2020   HGBA1C 5.5 11/07/2020      Assessment / Plan:      60yo male with multiple risk factors for coronary artery disease presented to the [facility] ED early this AM with chest discomfort ruled in for acute inferior STEMI. He [facility] loaded with Plavix 635m [facility] Dr. CBurt Knack Successful PTCI of the culprit distal RCA lesion was carried out.  Coronary angiography demonstrated a 70% mid to distal left main coronary stenosis along chronic total occlusion of the mid left anterior descending coronary artery.  His overall EF is 45-50% with anterolateral hypokinesis and basal inferior hypokinesis.  Coronary bypass grafting is his best option for complete revascularization. The procedure was described to Mr. MKaweckiand his sister who was in attendance and their questions were answered.  He would like for uKoreato proceed with pre-operative work up in preparation for coronary artery bypass grafting later this week.  He understands the timing of surgery may need to adjusted depending on his platelet function assay.    I  spent 25 minutes counseling the patient face to face.   MAntony Odea PA-C  11/07/2020 12:04 PM   I have seen and examined the patient and agree with the assessment and plan as outlined above by MEnid Cutter PA-C  Patient is a 60year old obese male with no previous history of coronary artery disease but multiple risk factors notable for history of hypertension, hyperlipidemia, remote tobacco use, and a strong family history of coronary artery disease.  He describes a 360-monthistory of classical symptoms of exertional angina culminating in his acute presentation  early this morning with acute inferior wall ST segment elevation myocardial infarction.  He was taken directly to the cardiac Cath Lab by Dr. Burt Knack where he underwent primary balloon angioplasty of the culprit right coronary artery lesion.  Additional findings performed at the time of catheterization revealed severe left main coronary artery stenosis with subtotal occlusion of the mid left anterior descending coronary artery but left the left collateral filling of the distal left anterior descending coronary  artery.  Following primary balloon angioplasty of the right coronary artery the patient immediately stabilized with resolution of his symptoms of chest pain.  Left ventricular end-diastolic pressure was normal.  There was moderate segmental left ventricular systolic dysfunction with ejection fraction estimated 45% including significant anterolateral hypokinesis suggestive of remote history of previous anterolateral wall myocardial infarction in the distant past.  At present the patient remains pain-free and without any shortness of breath.  Of note, the patient received a loading dose of 600 mg clopidogrel at the time of his presentation to [facility] in [place].  I have personally reviewed the patient's diagnostic cardiac catheterization and discussed findings with Dr. Burt Knack.  We both agree that patient would best be treated with surgical revascularization.  Presuming that the patient continues to remain stable we will tentatively plan to proceed with coronary artery bypass grafting on Friday, November 09, 2020. I have reviewed the indications, risks, and potential benefits of coronary artery bypass grafting with the patient and his sister at the bedside.  Alternative treatment strategies have been discussed, including the relative risks, benefits and long term prognosis associated with medical therapy, percutaneous coronary intervention, and surgical revascularization.  The patient understands and accepts all potential associated risks of surgery including but not limited to risk of death, stroke or other neurologic complication, myocardial infarction, congestive heart failure, respiratory failure, renal failure, bleeding requiring blood transfusion and/or reexploration, aortic dissection or other major vascular complication, arrhythmia, heart block or bradycardia requiring permanent pacemaker, pneumonia, pleural effusion, wound infection, pulmonary embolus or other thromboembolic complication, chronic  pain or other delayed complications related to median sternotomy, or the late recurrence of symptomatic ischemic heart disease and/or congestive heart failure.  The importance of long term risk modification have been emphasized.  All questions answered.   I spent in excess of 60 minutes during the conduct of this hospital encounter and >50% of this time involved direct face-to-face encounter with the patient for counseling and/or coordination of their care.     Rexene Alberts, MD 11/07/2020 1:35PM

## 2020-11-07 NOTE — Progress Notes (Signed)
CRITICAL RESULT PROVIDER NOTIFICATION  Test performed and critical result:  Troponin 741  Date and time result received:  11/07/20 0831  Provider name/title: Tonny Bollman MD  Date and time provider notified: 11/07/20 0832  Date and time provider responded: 11/07/20 0833  Provider response:No new orders

## 2020-11-08 ENCOUNTER — Inpatient Hospital Stay (HOSPITAL_COMMUNITY): Payer: 59

## 2020-11-08 DIAGNOSIS — I2111 ST elevation (STEMI) myocardial infarction involving right coronary artery: Secondary | ICD-10-CM | POA: Diagnosis not present

## 2020-11-08 DIAGNOSIS — Z01810 Encounter for preprocedural cardiovascular examination: Secondary | ICD-10-CM

## 2020-11-08 DIAGNOSIS — E785 Hyperlipidemia, unspecified: Secondary | ICD-10-CM

## 2020-11-08 DIAGNOSIS — I25110 Atherosclerotic heart disease of native coronary artery with unstable angina pectoris: Secondary | ICD-10-CM

## 2020-11-08 LAB — LIPID PANEL
Cholesterol: 211 mg/dL — ABNORMAL HIGH (ref 0–200)
HDL: 37 mg/dL — ABNORMAL LOW (ref >40)
LDL Cholesterol: 149 mg/dL — ABNORMAL HIGH (ref 0–99)
Total CHOL/HDL Ratio: 5.7 RATIO
Triglycerides: 126 mg/dL (ref <150)
VLDL: 25 mg/dL (ref 0–40)

## 2020-11-08 LAB — POCT I-STAT 7, (LYTES, BLD GAS, ICA,H+H)
Acid-Base Excess: 0 mmol/L (ref 0.0–2.0)
Bicarbonate: 23.9 mmol/L (ref 20.0–28.0)
Calcium, Ion: 1.23 mmol/L (ref 1.15–1.40)
HCT: 36 % — ABNORMAL LOW (ref 39.0–52.0)
Hemoglobin: 12.2 g/dL — ABNORMAL LOW (ref 13.0–17.0)
O2 Saturation: 96 %
Patient temperature: 98.8
Potassium: 4.1 mmol/L (ref 3.5–5.1)
Sodium: 138 mmol/L (ref 135–145)
TCO2: 25 mmol/L (ref 22–32)
pCO2 arterial: 37.2 mmHg (ref 32.0–48.0)
pH, Arterial: 7.417 (ref 7.350–7.450)
pO2, Arterial: 78 mmHg — ABNORMAL LOW (ref 83.0–108.0)

## 2020-11-08 LAB — COMPREHENSIVE METABOLIC PANEL
ALT: 36 U/L (ref 0–44)
AST: 82 U/L — ABNORMAL HIGH (ref 15–41)
Albumin: 3.4 g/dL — ABNORMAL LOW (ref 3.5–5.0)
Alkaline Phosphatase: 52 U/L (ref 38–126)
Anion gap: 7 (ref 5–15)
BUN: 10 mg/dL (ref 6–20)
CO2: 23 mmol/L (ref 22–32)
Calcium: 8.8 mg/dL — ABNORMAL LOW (ref 8.9–10.3)
Chloride: 106 mmol/L (ref 98–111)
Creatinine, Ser: 1.05 mg/dL (ref 0.61–1.24)
GFR, Estimated: >60 mL/min (ref >60)
Glucose, Bld: 110 mg/dL — ABNORMAL HIGH (ref 70–99)
Potassium: 4.2 mmol/L (ref 3.5–5.1)
Sodium: 136 mmol/L (ref 135–145)
Total Bilirubin: 0.5 mg/dL (ref 0.3–1.2)
Total Protein: 6.3 g/dL — ABNORMAL LOW (ref 6.5–8.1)

## 2020-11-08 LAB — TYPE AND SCREEN
ABO/RH(D): AB POS
Antibody Screen: NEGATIVE

## 2020-11-08 LAB — CBC
HCT: 39.6 % (ref 39.0–52.0)
Hemoglobin: 12.9 g/dL — ABNORMAL LOW (ref 13.0–17.0)
MCH: 30 pg (ref 26.0–34.0)
MCHC: 32.6 g/dL (ref 30.0–36.0)
MCV: 92.1 fL (ref 80.0–100.0)
Platelets: 288 10*3/uL (ref 150–400)
RBC: 4.3 MIL/uL (ref 4.22–5.81)
RDW: 12.7 % (ref 11.5–15.5)
WBC: 11 10*3/uL — ABNORMAL HIGH (ref 4.0–10.5)
nRBC: 0 % (ref 0.0–0.2)

## 2020-11-08 LAB — HEMOGLOBIN A1C
Hgb A1c MFr Bld: 5.6 % (ref 4.8–5.6)
Mean Plasma Glucose: 114.02 mg/dL

## 2020-11-08 LAB — ABO/RH: ABO/RH(D): AB POS

## 2020-11-08 MED ORDER — TRANEXAMIC ACID (OHS) BOLUS VIA INFUSION
15.0000 mg/kg | INTRAVENOUS | Status: AC
  Administered 2020-11-09: 1585.5 mg via INTRAVENOUS
  Filled 2020-11-08: qty 1586

## 2020-11-08 MED ORDER — SODIUM CHLORIDE 0.9 % IV SOLN
INTRAVENOUS | Status: DC
  Filled 2020-11-08: qty 30

## 2020-11-08 MED ORDER — CHLORHEXIDINE GLUCONATE 4 % EX LIQD
60.0000 mL | Freq: Once | CUTANEOUS | Status: AC
  Administered 2020-11-09: 4 via TOPICAL
  Filled 2020-11-08: qty 60

## 2020-11-08 MED ORDER — POTASSIUM CHLORIDE 2 MEQ/ML IV SOLN
80.0000 meq | INTRAVENOUS | Status: DC
  Filled 2020-11-08: qty 40

## 2020-11-08 MED ORDER — MAGNESIUM SULFATE 50 % IJ SOLN
40.0000 meq | INTRAMUSCULAR | Status: DC
  Filled 2020-11-08: qty 9.85

## 2020-11-08 MED ORDER — NITROGLYCERIN IN D5W 200-5 MCG/ML-% IV SOLN
2.0000 ug/min | INTRAVENOUS | Status: DC
  Filled 2020-11-08: qty 250

## 2020-11-08 MED ORDER — PLASMA-LYTE 148 IV SOLN
INTRAVENOUS | Status: DC
  Filled 2020-11-08: qty 2.5

## 2020-11-08 MED ORDER — CHLORHEXIDINE GLUCONATE 0.12 % MT SOLN
15.0000 mL | Freq: Once | OROMUCOSAL | Status: AC
  Administered 2020-11-09: 15 mL via OROMUCOSAL
  Filled 2020-11-08: qty 15

## 2020-11-08 MED ORDER — BISACODYL 5 MG PO TBEC
5.0000 mg | DELAYED_RELEASE_TABLET | Freq: Once | ORAL | Status: AC
  Administered 2020-11-08: 5 mg via ORAL
  Filled 2020-11-08: qty 1

## 2020-11-08 MED ORDER — VANCOMYCIN HCL 1000 MG IV SOLR
INTRAVENOUS | Status: DC
  Filled 2020-11-08: qty 1000

## 2020-11-08 MED ORDER — EPINEPHRINE HCL 5 MG/250ML IV SOLN IN NS
0.0000 ug/min | INTRAVENOUS | Status: DC
  Filled 2020-11-08: qty 250

## 2020-11-08 MED ORDER — TRANEXAMIC ACID 1000 MG/10ML IV SOLN
1.5000 mg/kg/h | INTRAVENOUS | Status: AC
  Administered 2020-11-09: 1.5 mg/kg/h via INTRAVENOUS
  Filled 2020-11-08: qty 25

## 2020-11-08 MED ORDER — SODIUM CHLORIDE 0.9 % IV SOLN
1.5000 g | INTRAVENOUS | Status: AC
  Administered 2020-11-09: 1.5 g via INTRAVENOUS
  Filled 2020-11-08: qty 1.5

## 2020-11-08 MED ORDER — VANCOMYCIN HCL 1500 MG/300ML IV SOLN
1500.0000 mg | INTRAVENOUS | Status: AC
  Administered 2020-11-09: 1500 mg via INTRAVENOUS
  Filled 2020-11-08: qty 300

## 2020-11-08 MED ORDER — MILRINONE LACTATE IN DEXTROSE 20-5 MG/100ML-% IV SOLN
0.3000 ug/kg/min | INTRAVENOUS | Status: DC
  Filled 2020-11-08: qty 100

## 2020-11-08 MED ORDER — SODIUM CHLORIDE 0.9 % IV SOLN
750.0000 mg | INTRAVENOUS | Status: AC
  Administered 2020-11-09: 750 mg via INTRAVENOUS
  Filled 2020-11-08: qty 750

## 2020-11-08 MED ORDER — PHENYLEPHRINE HCL-NACL 20-0.9 MG/250ML-% IV SOLN
30.0000 ug/min | INTRAVENOUS | Status: AC
  Administered 2020-11-09: 50 ug/min via INTRAVENOUS
  Filled 2020-11-08: qty 250

## 2020-11-08 MED ORDER — DEXMEDETOMIDINE HCL IN NACL 400 MCG/100ML IV SOLN
0.1000 ug/kg/h | INTRAVENOUS | Status: AC
  Administered 2020-11-09: .7 ug/kg/h via INTRAVENOUS
  Filled 2020-11-08: qty 100

## 2020-11-08 MED ORDER — CHLORHEXIDINE GLUCONATE 4 % EX LIQD
60.0000 mL | Freq: Once | CUTANEOUS | Status: AC
  Administered 2020-11-08: 4 via TOPICAL
  Filled 2020-11-08: qty 60

## 2020-11-08 MED ORDER — METOPROLOL TARTRATE 12.5 MG HALF TABLET
12.5000 mg | ORAL_TABLET | Freq: Once | ORAL | Status: AC
  Administered 2020-11-09: 12.5 mg via ORAL
  Filled 2020-11-08: qty 1

## 2020-11-08 MED ORDER — TRANEXAMIC ACID (OHS) PUMP PRIME SOLUTION
2.0000 mg/kg | INTRAVENOUS | Status: DC
  Filled 2020-11-08: qty 2.11

## 2020-11-08 MED ORDER — MANNITOL 20 % IV SOLN
Freq: Once | INTRAVENOUS | Status: DC
  Filled 2020-11-08: qty 13

## 2020-11-08 MED ORDER — NOREPINEPHRINE 4 MG/250ML-% IV SOLN
0.0000 ug/min | INTRAVENOUS | Status: DC
  Filled 2020-11-08: qty 250

## 2020-11-08 MED ORDER — INSULIN REGULAR(HUMAN) IN NACL 100-0.9 UT/100ML-% IV SOLN
INTRAVENOUS | Status: AC
  Administered 2020-11-09: 1 [IU]/h via INTRAVENOUS
  Filled 2020-11-08: qty 100

## 2020-11-08 MED ORDER — TEMAZEPAM 15 MG PO CAPS: 15.0000 mg | ORAL_CAPSULE | Freq: Once | ORAL | Status: DC | PRN

## 2020-11-08 MED FILL — Heparin Sod (Porcine)-NaCl IV Soln 1000 Unit/500ML-0.9%: INTRAVENOUS | Qty: 500 | Status: AC

## 2020-11-08 NOTE — Anesthesia Preprocedure Evaluation (Addendum)
Anesthesia Evaluation  Patient identified by MRN, date of birth, ID band Patient awake    Reviewed: Allergy & Precautions, NPO status , Patient's Chart, lab work & pertinent test results  History of Anesthesia Complications Negative for: history of anesthetic complications  Airway Mallampati: IV  TM Distance: >3 FB Neck ROM: Full    Dental  (+) Dental Advisory Given   Pulmonary COPD, former smoker,  11/07/2020 SARS coronavirus NEG   breath sounds clear to auscultation       Cardiovascular hypertension, Pt. on medications (-) angina+ Past MI   Rhythm:Regular Rate:Normal  11/07/2020 ECHO: EF 45-50%, mild LVH, hypokinetic apex  11/07/2020 cath: severe LM, chronic LAD occlusion, mod mid Cx, critical RCA 95%   Neuro/Psych negative neurological ROS     GI/Hepatic negative GI ROS, Neg liver ROS,   Endo/Other  Morbid obesity  Renal/GU negative Renal ROS     Musculoskeletal   Abdominal (+) + obese,   Peds  Hematology negative hematology ROS (+)   Anesthesia Other Findings   Reproductive/Obstetrics                            Anesthesia Physical Anesthesia Plan  ASA: IV  Anesthesia Plan: General   Post-op Pain Management:    Induction: Intravenous  PONV Risk Score and Plan: 2 and Treatment may vary due to age or medical condition  Airway Management Planned: Oral ETT  Additional Equipment: Arterial line, PA Cath, TEE and Ultrasound Guidance Line Placement  Intra-op Plan:   Post-operative Plan: Post-operative intubation/ventilation  Informed Consent: I have reviewed the patients History and Physical, chart, labs and discussed the procedure including the risks, benefits and alternatives for the proposed anesthesia with the patient or authorized representative who has indicated his/her understanding and acceptance.     Dental advisory given  Plan Discussed with: CRNA and  Surgeon  Anesthesia Plan Comments:        Anesthesia Quick Evaluation

## 2020-11-08 NOTE — Progress Notes (Signed)
Progress Note  Patient Name: Jorge Molina. Date of Encounter: 11/08/2020  [facility] Cardiologist: No primary care provider on file. Cooper  Subjective   No chest pain or complaints this morning.  No bleeding.  Inpatient Medications    Scheduled Meds: . aspirin EC  81 mg Oral Daily  . atorvastatin  80 mg Oral Daily  . Chlorhexidine Gluconate Cloth  6 each Topical Daily  . metoprolol tartrate  12.5 mg Oral BID  . mupirocin ointment  1 application Nasal BID  . sodium chloride flush  3 mL Intravenous Q12H   Continuous Infusions: . sodium chloride Stopped (11/07/20 2333)  . tirofiban 0.15 mcg/kg/min (11/08/20 0900)   PRN Meds: sodium chloride, acetaminophen, morphine injection, nitroGLYCERIN, ondansetron (ZOFRAN) IV, oxyCODONE, sodium chloride flush   Vital Signs    Vitals:   11/08/20 0700 11/08/20 0745 11/08/20 0800 11/08/20 0900  BP: 119/88  123/85 129/80  Pulse: 78 97 86 89  Resp: 19 20 19 20   Temp:  98.6 F (37 C)    TempSrc:  Oral    SpO2: 93% 93% 93% 94%  Weight:      Height:        Intake/Output Summary (Last 24 hours) at 11/08/2020 0933 Last data filed at 11/08/2020 0900 Gross per 24 hour  Intake 1152.86 ml  Output 3105 ml  Net -1952.14 ml   Last 3 Weights 11/08/2020 11/07/2020 01/18/2014  Weight (lbs) 233 lb 235 lb 210 lb  Weight (kg) 105.688 kg 106.595 kg 95.255 kg      Telemetry    Sinus rhythm- Personally Reviewed  ECG    EKG this morning with normal sinus rhythm, poor R wave progression V1 through V4, new inferior Q waves with minimal ST elevation in evolutionary T wave changes.- Personally Reviewed  Physical Exam  Overweight GEN: No acute distress.   Neck: No JVD Cardiac: RRR, no murmurs, rubs, or gallops.  The right radial cath site is unremarkable Respiratory: Clear to auscultation bilaterally. GI: Soft, nontender, non-distended  MS: No edema; No deformity. Neuro:  Nonfocal  Psych: Normal affect   Labs    High Sensitivity  Troponin:   Recent Labs  Lab 11/07/20 0720 11/07/20 0914 11/07/20 1057  TROPONINIHS 741* 6,817* 12,921*      Chemistry Recent Labs  Lab 11/07/20 0717 11/07/20 0720 11/08/20 0139 11/08/20 0301  NA 137 134* 136 138  K 4.3 4.1 4.2 4.1  CL 103 102 106  --   CO2  --  23 23  --   GLUCOSE 139* 136* 110*  --   BUN 15 14 10   --   CREATININE 0.90 1.11 1.05  --   CALCIUM  --  8.9 8.8*  --   PROT  --  6.5 6.3*  --   ALBUMIN  --  3.8 3.4*  --   AST  --  32 82*  --   ALT  --  29 36  --   ALKPHOS  --  62 52  --   BILITOT  --  0.5 0.5  --   GFRNONAA  --  >60 >60  --   ANIONGAP  --  9 7  --      Hematology Recent Labs  Lab 11/07/20 0720 11/08/20 0139 11/08/20 0301  WBC 9.7 11.0*  --   RBC 4.49 4.30  --   HGB 13.4 12.9* 12.2*  HCT 39.7 39.6 36.0*  MCV 88.4 92.1  --   MCH 29.8 30.0  --  MCHC 33.8 32.6  --   RDW 12.3 12.7  --   PLT 305 288  --     BNPNo results for input(s): BNP, PROBNP in the last 168 hours.   DDimer No results for input(s): DDIMER in the last 168 hours.   Radiology    CARDIAC CATHETERIZATION  Result Date: 11/07/2020  Prox RCA to Mid RCA lesion is 40% stenosed.  Dist RCA lesion is 95% stenosed.  Mid LM to Dist LM lesion is 70% stenosed.  Mid LAD lesion is 100% stenosed.  1st Mrg lesion is 65% stenosed.  There is mild to moderate left ventricular systolic dysfunction.  LV end diastolic pressure is normal.  The left ventricular ejection fraction is 45-50% by visual estimate.  Balloon angioplasty was performed using a BALLOON SAPPHIRE 3.0X12.  Post intervention, there is a 35% residual stenosis.  1.  Severe mid to distal left main stenosis 2.  Chronic total occlusion of the LAD just after the first diagonal branch with the mid and distal vessel filling late via left to left collaterals 3.  Moderate mid circumflex stenosis into a large caliber first obtuse marginal vessel 4.  Critical stenosis of the distal RCA (culprit lesion), treated successfully  with balloon angioplasty using a 3.0 x 12 mm balloon 5.  Mild to moderate segmental left ventricular systolic dysfunction with anterolateral hypokinesis and basal inferior hypokinesis of the LV, estimated LVEF 45% Recommendations: Because of the patient's multivessel disease with left main stenosis, I reviewed the patient's cardiac catheterization films with Dr. Cornelius Moras.  We discussed treatment options and agreed that primary PCI to stabilize the patient's distal RCA is appropriate after this patient was loaded with 600 mg clopidogrel this morning.  He has surgical coronary anatomy, but will have reduced bleeding risk once Plavix washout occurs.  Other lesions appear stable/chronic based on angiographic features.  Will initiate IV tirofiban and patient will have cardiac surgical consultation for consideration of multivessel CABG during his index hospitalization.   DG Chest Port 1 View  Result Date: 11/08/2020 CLINICAL DATA:  Chest pain.  Preoperative exam. EXAM: PORTABLE CHEST 1 VIEW COMPARISON:  04/22/2001. FINDINGS: Mediastinum hilar structures normal. Cardiomegaly. No pulmonary venous congestion. Low lung volumes. No focal infiltrate. No pleural effusion or pneumothorax. Degenerative change thoracic spine. IMPRESSION: 1. Cardiomegaly. No pulmonary venous congestion. 2. Low lung volumes. Electronically Signed   By: Maisie Fus  Register   On: 11/08/2020 07:15   ECHOCARDIOGRAM COMPLETE  Result Date: 11/07/2020    ECHOCARDIOGRAM REPORT   Patient Name:   Jorge Molina. Date of Exam: 11/07/2020 Medical Rec #:  [id number]         Height:       67.0 in Accession #:    [id number]        Weight:       235.0 lb Date of Birth:  12-Nov-1960          BSA:          2.166 m Patient Age:    60 years          BP:           148/102 mmHg Patient Gender: M                 HR:           77 bpm. Exam Location:  Inpatient Procedure: 2D Echo, Cardiac Doppler, Color Doppler and Intracardiac            Opacification Agent Indications:  121-121.4 ST elevation (STEMI) and non-ST elevation (NSTEMI)                 myocardial infarction  History:        Patient has no prior history of Echocardiogram examinations. CAD                 and Acute MI, Signs/Symptoms:Chest Pain; Risk                 Factors:Hypertension and Dyslipidemia.  Sonographer:    Sheralyn Boatmanina West RDCS Referring Phys: [phone] MICHAEL COOPER  Sonographer Comments: Technically difficult study due to poor echo windows. Image acquisition challenging due to patient body habitus. Supine, could not turn. Post cath procedure. IMPRESSIONS  1. Left ventricular ejection fraction, by estimation, is 45 to 50%. The left ventricle has mildly decreased function. The left ventricle demonstrates regional wall motion abnormalities (see scoring diagram/findings for description). There is mild left ventricular hypertrophy. Left ventricular diastolic parameters were normal.  2. Right ventricular systolic function is normal. The right ventricular size is normal. Tricuspid regurgitation signal is inadequate for assessing PA pressure.  3. The mitral valve is normal in structure. No evidence of mitral valve regurgitation.  4. The aortic valve is tricuspid. Aortic valve regurgitation is trivial. No aortic stenosis is present.  5. Aortic dilatation noted. There is mild dilatation of the ascending aorta, measuring 40 mm.  6. The inferior vena cava is dilated in size with >50% respiratory variability, suggesting right atrial pressure of 8 mmHg. FINDINGS  Left Ventricle: Left ventricular ejection fraction, by estimation, is 45 to 50%. The left ventricle has mildly decreased function. The left ventricle demonstrates regional wall motion abnormalities. Definity contrast agent was given IV to delineate the left ventricular endocardial borders. The left ventricular internal cavity size was normal in size. There is mild left ventricular hypertrophy. Left ventricular diastolic parameters were normal.  LV Wall Scoring: The apical  septal segment, apical anterior segment, apical inferior segment, and apex are hypokinetic. The anterior wall, entire lateral wall, anterior septum, inferior wall, mid inferoseptal segment, and basal inferoseptal segment are normal. Right Ventricle: The right ventricular size is normal. No increase in right ventricular wall thickness. Right ventricular systolic function is normal. Tricuspid regurgitation signal is inadequate for assessing PA pressure. Left Atrium: Left atrial size was normal in size. Right Atrium: Right atrial size was normal in size. Pericardium: Trivial pericardial effusion is present. Mitral Valve: The mitral valve is normal in structure. No evidence of mitral valve regurgitation. Tricuspid Valve: The tricuspid valve is normal in structure. Tricuspid valve regurgitation is not demonstrated. Aortic Valve: The aortic valve is tricuspid. Aortic valve regurgitation is trivial. No aortic stenosis is present. Pulmonic Valve: The pulmonic valve was not well visualized. Pulmonic valve regurgitation is not visualized. Aorta: The aortic root is normal in size and structure and aortic dilatation noted. There is mild dilatation of the ascending aorta, measuring 40 mm. Venous: The inferior vena cava is dilated in size with greater than 50% respiratory variability, suggesting right atrial pressure of 8 mmHg. IAS/Shunts: The interatrial septum was not well visualized.  LEFT VENTRICLE PLAX 2D LVIDd:         4.70 cm      Diastology LVIDs:         3.60 cm      LV e' medial:    7.07 cm/s LV PW:         1.40 cm      LV E/e' medial:  8.5 LV IVS:        1.30 cm      LV e' lateral:   9.25 cm/s LVOT diam:     2.20 cm      LV E/e' lateral: 6.5 LV SV:         68 LV SV Index:   32 LVOT Area:     3.80 cm  LV Volumes (MOD) LV vol d, MOD A2C: 110.0 ml LV vol d, MOD A4C: 131.0 ml LV vol s, MOD A2C: 59.7 ml LV vol s, MOD A4C: 72.5 ml LV SV MOD A2C:     50.3 ml LV SV MOD A4C:     131.0 ml LV SV MOD BP:      51.4 ml RIGHT  VENTRICLE             IVC RV S prime:     14.10 cm/s  IVC diam: 2.10 cm TAPSE (M-mode): 1.9 cm LEFT ATRIUM             Index       RIGHT ATRIUM          Index LA diam:        3.20 cm 1.48 cm/m  RA Area:     9.65 cm LA Vol (A2C):   26.1 ml 12.05 ml/m RA Volume:   16.70 ml 7.71 ml/m LA Vol (A4C):   31.5 ml 14.54 ml/m LA Biplane Vol: 31.1 ml 14.36 ml/m  AORTIC VALVE LVOT Vmax:   95.30 cm/s LVOT Vmean:  62.600 cm/s LVOT VTI:    0.180 m  AORTA Ao Root diam: 3.40 cm Ao Asc diam:  4.00 cm MITRAL VALVE MV Area (PHT): 3.74 cm    SHUNTS MV Decel Time: 203 msec    Systemic VTI:  0.18 m MV E velocity: 60.20 cm/s  Systemic Diam: 2.20 cm MV A velocity: 65.15 cm/s MV E/A ratio:  0.92 Epifanio Lesches MD Electronically signed by Epifanio Lesches MD Signature Date/Time: 11/07/2020/2:36:24 PM    Final     Cardiac Studies   Cardiac cath and PCI 11/07/2020 Diagnostic Dominance: Right        Intervention      ECHOCARDIOGRAM 11/07/2020  Patient Profile     60 y.o. male with inferolateral STEMI, presenting in transfer from [facility].  Found to have severe multivessel disease with subtotal occlusion of LAD and acute thrombotic occlusion of RCA.  RCA was treated with angioplasty only.  Plan coronary artery bypass grafting.  Patient was loaded with clopidogrel 600 mg single dose at [facility] emergency room.  Assessment & Plan    1. Inferior ST elevation MI: Treated with angioplasty only to the right coronary artery.  High-grade complex left coronary disease with residual RCA disease.  Plan is multivessel coronary bypass grafting by Dr. Cornelius Moras 2. CAD: Awaiting CABG and washout of clopidogrel 3. Hyperlipidemia, LDL target less than 70: Untreated 4. Anemia: Likely related to phlebotomy and anticoagulation therapy with volume expansion 5. Prediabetes 6. Prior smoker  For questions or updates, please contact [facility] Please consult [email or web address] for contact info under         Signed, Lesleigh Noe, MD  11/08/2020, 9:33 AM

## 2020-11-08 NOTE — Progress Notes (Signed)
CARDIAC REHAB PHASE I   Preop education completed with pt. Pt given IS and able to demonstrate 2500. Pt given in-the-tube sheet, cardiac surgery booklet, and [facility] care guide. Pt states he will be staying with his mother after d/c and has access to a walker. Pt anxious to get surgery done with and begin rehab. Will continue to follow pt throughout hospital stay.  [id number] Reynold Bowen, RN BSN 11/08/2020 2:15 PM

## 2020-11-08 NOTE — Progress Notes (Signed)
Pre-CABG testing has been completed. Preliminary results can be found in CV Proc through chart review.   11/08/20 10:59 AM Olen Cordial RVT

## 2020-11-08 NOTE — Progress Notes (Signed)
[address]       [place] [address]             [phone]     CARDIOTHORACIC SURGERY PROGRESS NOTE  1 Day Post-Op  S/P Procedure(s) (LRB): LEFT HEART CATH AND CORONARY ANGIOGRAPHY (N/A) Coronary/Graft Acute MI Revascularization (N/A)  Subjective: No complaints.  No chest pain  Objective: Vital signs in last 24 hours: Temp:  [98.2 F (36.8 C)-99.3 F (37.4 C)] 98.2 F (36.8 C) (03/24 1100) Pulse Rate:  [73-110] 110 (03/24 1600) Resp:  [0-24] 21 (03/24 1600) BP: (102-144)/(66-95) 130/79 (03/24 1600) SpO2:  [90 %-98 %] 98 % (03/24 1600) Weight:  [105.7 kg] 105.7 kg (03/24 0600)  Physical Exam:  Rhythm:   sinus  Breath sounds: clear  Heart sounds:  RRR  Incisions:  n/a  Abdomen:  soft  Extremities:  Warm, well perfused   Intake/Output from previous day: 03/23 0701 - 03/24 0700 In: 867.5 [P.O.:240; I.V.:627.5] Out: 2725 [Urine:2725] Intake/Output this shift: Total I/O In: 661.7 [P.O.:487; I.V.:174.7] Out: 1160 [Urine:1160]  Lab Results: Recent Labs    11/07/20 0720 11/08/20 0139 11/08/20 0301  WBC 9.7 11.0*  --   HGB 13.4 12.9* 12.2*  HCT 39.7 39.6 36.0*  PLT 305 288  --    BMET:  Recent Labs    11/07/20 0720 11/08/20 0139 11/08/20 0301  NA 134* 136 138  K 4.1 4.2 4.1  CL 102 106  --   CO2 23 23  --   GLUCOSE 136* 110*  --   BUN 14 10  --   CREATININE 1.11 1.05  --   CALCIUM 8.9 8.8*  --     CBG (last 3)  No results for input(s): GLUCAP in the last 72 hours. PT/INR:   Recent Labs    11/07/20 0720  LABPROT 12.9  INR 1.0    CXR:  PORTABLE CHEST 1 VIEW  COMPARISON:  04/22/2001.  FINDINGS: Mediastinum hilar structures normal. Cardiomegaly. No pulmonary venous congestion. Low lung volumes. No focal infiltrate. No pleural effusion or pneumothorax. Degenerative change thoracic spine.  IMPRESSION: 1. Cardiomegaly. No pulmonary venous congestion. 2. Low lung volumes.   Electronically Signed   By: Maisie Fus   Register   On: 11/08/2020 07:15     ECHOCARDIOGRAM REPORT      Patient Name:  Jorge Molina. Date of Exam: 11/07/2020  Medical Rec #: [id number]     Height:    67.0 in  Accession #:  [id number]    Weight:    235.0 lb  Date of Birth: 26-Aug-1960     BSA:     2.166 m  Patient Age:  60 years     BP:      148/102 mmHg  Patient Gender: M         HR:      77 bpm.  Exam Location: Inpatient   Procedure: 2D Echo, Cardiac Doppler, Color Doppler and Intracardiac       Opacification Agent   Indications:  121-121.4 ST elevation (STEMI) and non-ST elevation  (NSTEMI)         myocardial infarction    History:    Patient has no prior history of Echocardiogram  examinations. CAD         and Acute MI, Signs/Symptoms:Chest Pain; Risk         Factors:Hypertension and Dyslipidemia.    Sonographer:  Sheralyn Boatman RDCS  Referring Phys: [phone]  Sonographer Comments: Technically difficult study due to poor echo  windows. Image acquisition challenging due to patient body habitus.  Supine, could not turn. Post cath procedure.  IMPRESSIONS    1. Left ventricular ejection fraction, by estimation, is 45 to 50%. The  left ventricle has mildly decreased function. The left ventricle  demonstrates regional wall motion abnormalities (see scoring  diagram/findings for description). There is mild left  ventricular hypertrophy. Left ventricular diastolic parameters were  normal.  2. Right ventricular systolic function is normal. The right ventricular  size is normal. Tricuspid regurgitation signal is inadequate for assessing  PA pressure.  3. The mitral valve is normal in structure. No evidence of mitral valve  regurgitation.  4. The aortic valve is tricuspid. Aortic valve regurgitation is trivial.  No aortic stenosis is present.  5. Aortic dilatation noted. There is mild dilatation of  the ascending  aorta, measuring 40 mm.  6. The inferior vena cava is dilated in size with >50% respiratory  variability, suggesting right atrial pressure of 8 mmHg.   FINDINGS  Left Ventricle: Left ventricular ejection fraction, by estimation, is 45  to 50%. The left ventricle has mildly decreased function. The left  ventricle demonstrates regional wall motion abnormalities. Definity  contrast agent was given IV to delineate the  left ventricular endocardial borders. The left ventricular internal cavity  size was normal in size. There is mild left ventricular hypertrophy. Left  ventricular diastolic parameters were normal.     LV Wall Scoring:  The apical septal segment, apical anterior segment, apical inferior  segment,  and apex are hypokinetic. The anterior wall, entire lateral wall, anterior  septum, inferior wall, mid inferoseptal segment, and basal inferoseptal  segment are normal.   Right Ventricle: The right ventricular size is normal. No increase in  right ventricular wall thickness. Right ventricular systolic function is  normal. Tricuspid regurgitation signal is inadequate for assessing PA  pressure.   Left Atrium: Left atrial size was normal in size.   Right Atrium: Right atrial size was normal in size.   Pericardium: Trivial pericardial effusion is present.   Mitral Valve: The mitral valve is normal in structure. No evidence of  mitral valve regurgitation.   Tricuspid Valve: The tricuspid valve is normal in structure. Tricuspid  valve regurgitation is not demonstrated.   Aortic Valve: The aortic valve is tricuspid. Aortic valve regurgitation is  trivial. No aortic stenosis is present.   Pulmonic Valve: The pulmonic valve was not well visualized. Pulmonic valve  regurgitation is not visualized.   Aorta: The aortic root is normal in size and structure and aortic  dilatation noted. There is mild dilatation of the ascending aorta,  measuring 40 mm.    Venous: The inferior vena cava is dilated in size with greater than 50%  respiratory variability, suggesting right atrial pressure of 8 mmHg.   IAS/Shunts: The interatrial septum was not well visualized.     LEFT VENTRICLE  PLAX 2D  LVIDd:     4.70 cm   Diastology  LVIDs:     3.60 cm   LV e' medial:  7.07 cm/s  LV PW:     1.40 cm   LV E/e' medial: 8.5  LV IVS:    1.30 cm   LV e' lateral:  9.25 cm/s  LVOT diam:   2.20 cm   LV E/e' lateral: 6.5  LV SV:     68  LV SV Index:  32  LVOT Area:   3.80  cm    LV Volumes (MOD)  LV vol d, MOD A2C: 110.0 ml  LV vol d, MOD A4C: 131.0 ml  LV vol s, MOD A2C: 59.7 ml  LV vol s, MOD A4C: 72.5 ml  LV SV MOD A2C:   50.3 ml  LV SV MOD A4C:   131.0 ml  LV SV MOD BP:   51.4 ml   RIGHT VENTRICLE       IVC  RV S prime:   14.10 cm/s IVC diam: 2.10 cm  TAPSE (M-mode): 1.9 cm   LEFT ATRIUM       Index    RIGHT ATRIUM     Index  LA diam:    3.20 cm 1.48 cm/m RA Area:   9.65 cm  LA Vol (A2C):  26.1 ml 12.05 ml/m RA Volume:  16.70 ml 7.71 ml/m  LA Vol (A4C):  31.5 ml 14.54 ml/m  LA Biplane Vol: 31.1 ml 14.36 ml/m  AORTIC VALVE  LVOT Vmax:  95.30 cm/s  LVOT Vmean: 62.600 cm/s  LVOT VTI:  0.180 m    AORTA  Ao Root diam: 3.40 cm  Ao Asc diam: 4.00 cm   MITRAL VALVE  MV Area (PHT): 3.74 cm  SHUNTS  MV Decel Time: 203 msec  Systemic VTI: 0.18 m  MV E velocity: 60.20 cm/s Systemic Diam: 2.20 cm  MV A velocity: 65.15 cm/s  MV E/A ratio: 0.92   Epifanio Lesches MD  Electronically signed by Epifanio Lesches MD  Signature Date/Time: 11/07/2020/2:36:24 PM       Assessment/Plan: S/P Procedure(s) (LRB): LEFT HEART CATH AND CORONARY ANGIOGRAPHY (N/A) Coronary/Graft Acute MI Revascularization (N/A)  I have again reviewed the indications, risks, and potential benefits of coronary artery bypass grafting with the patient this  afternoon.  Alternative treatment strategies have been discussed, including the relative risks, benefits and long term prognosis associated with medical therapy, percutaneous coronary intervention, and surgical revascularization.  The patient understands and accepts all potential associated risks of surgery including but not limited to risk of death, stroke or other neurologic complication, myocardial infarction, congestive heart failure, respiratory failure, renal failure, bleeding requiring blood transfusion and/or reexploration, aortic dissection or other major vascular complication, arrhythmia, heart block or bradycardia requiring permanent pacemaker, pneumonia, pleural effusion, wound infection, pulmonary embolus or other thromboembolic complication, chronic pain or other delayed complications related to median sternotomy, or the late recurrence of symptomatic ischemic heart disease and/or congestive heart failure.  The importance of long term risk modification have been emphasized.  All questions answered.  For OR tomorrow.   I spent in excess of 15 minutes during the conduct of this hospital encounter and >50% of this time involved direct face-to-face encounter with the patient for counseling and/or coordination of their care.     Purcell Nails, MD 11/08/2020 4:10 PM

## 2020-11-09 ENCOUNTER — Inpatient Hospital Stay (HOSPITAL_COMMUNITY): Payer: 59

## 2020-11-09 ENCOUNTER — Inpatient Hospital Stay (HOSPITAL_COMMUNITY): Payer: 59 | Admitting: Anesthesiology

## 2020-11-09 ENCOUNTER — Inpatient Hospital Stay (HOSPITAL_COMMUNITY)
Admission: RE | Disposition: A | Discharge status: Home / Self Care | Payer: Self-pay | Source: Other Acute Inpatient Hospital | Attending: Thoracic Surgery (Cardiothoracic Vascular Surgery)

## 2020-11-09 ENCOUNTER — Encounter (HOSPITAL_COMMUNITY): Payer: Self-pay | Admitting: Cardiovascular Disease

## 2020-11-09 DIAGNOSIS — Z951 Presence of aortocoronary bypass graft: Secondary | ICD-10-CM | POA: Diagnosis not present

## 2020-11-09 DIAGNOSIS — I2111 ST elevation (STEMI) myocardial infarction involving right coronary artery: Secondary | ICD-10-CM | POA: Diagnosis not present

## 2020-11-09 HISTORY — DX: Presence of aortocoronary bypass graft: Z95.1

## 2020-11-09 HISTORY — PX: TEE WITHOUT CARDIOVERSION: SHX5443

## 2020-11-09 HISTORY — PX: CORONARY ARTERY BYPASS GRAFT: SHX141

## 2020-11-09 LAB — POCT I-STAT, CHEM 8
BUN: 17 mg/dL (ref 6–20)
BUN: 13 mg/dL (ref 6–20)
BUN: 15 mg/dL (ref 6–20)
BUN: 13 mg/dL (ref 6–20)
BUN: 13 mg/dL (ref 6–20)
Calcium, Ion: 1.29 mmol/L (ref 1.15–1.40)
Calcium, Ion: 0.99 mmol/L — ABNORMAL LOW (ref 1.15–1.40)
Calcium, Ion: 1.14 mmol/L — ABNORMAL LOW (ref 1.15–1.40)
Calcium, Ion: 1.07 mmol/L — ABNORMAL LOW (ref 1.15–1.40)
Calcium, Ion: 1.08 mmol/L — ABNORMAL LOW (ref 1.15–1.40)
Chloride: 103 mmol/L (ref 98–111)
Chloride: 100 mmol/L (ref 98–111)
Chloride: 104 mmol/L (ref 98–111)
Chloride: 102 mmol/L (ref 98–111)
Chloride: 105 mmol/L (ref 98–111)
Creatinine, Ser: 1 mg/dL (ref 0.61–1.24)
Creatinine, Ser: 0.8 mg/dL (ref 0.61–1.24)
Creatinine, Ser: 1 mg/dL (ref 0.61–1.24)
Creatinine, Ser: 0.9 mg/dL (ref 0.61–1.24)
Creatinine, Ser: 0.9 mg/dL (ref 0.61–1.24)
Glucose, Bld: 111 mg/dL — ABNORMAL HIGH (ref 70–99)
Glucose, Bld: 106 mg/dL — ABNORMAL HIGH (ref 70–99)
Glucose, Bld: 92 mg/dL (ref 70–99)
Glucose, Bld: 125 mg/dL — ABNORMAL HIGH (ref 70–99)
Glucose, Bld: 132 mg/dL — ABNORMAL HIGH (ref 70–99)
HCT: 38 % — ABNORMAL LOW (ref 39.0–52.0)
HCT: 23 % — ABNORMAL LOW (ref 39.0–52.0)
HCT: 34 % — ABNORMAL LOW (ref 39.0–52.0)
HCT: 26 % — ABNORMAL LOW (ref 39.0–52.0)
HCT: 27 % — ABNORMAL LOW (ref 39.0–52.0)
Hemoglobin: 12.9 g/dL — ABNORMAL LOW (ref 13.0–17.0)
Hemoglobin: 11.6 g/dL — ABNORMAL LOW (ref 13.0–17.0)
Hemoglobin: 7.8 g/dL — ABNORMAL LOW (ref 13.0–17.0)
Hemoglobin: 8.8 g/dL — ABNORMAL LOW (ref 13.0–17.0)
Hemoglobin: 9.2 g/dL — ABNORMAL LOW (ref 13.0–17.0)
Potassium: 4.4 mmol/L (ref 3.5–5.1)
Potassium: 4.7 mmol/L (ref 3.5–5.1)
Potassium: 4.8 mmol/L (ref 3.5–5.1)
Potassium: 5.2 mmol/L — ABNORMAL HIGH (ref 3.5–5.1)
Potassium: 6 mmol/L — ABNORMAL HIGH (ref 3.5–5.1)
Sodium: 140 mmol/L (ref 135–145)
Sodium: 139 mmol/L (ref 135–145)
Sodium: 140 mmol/L (ref 135–145)
Sodium: 136 mmol/L (ref 135–145)
Sodium: 139 mmol/L (ref 135–145)
TCO2: 31 mmol/L (ref 22–32)
TCO2: 26 mmol/L (ref 22–32)
TCO2: 29 mmol/L (ref 22–32)
TCO2: 27 mmol/L (ref 22–32)
TCO2: 27 mmol/L (ref 22–32)

## 2020-11-09 LAB — CBC
HCT: 44.1 % (ref 39.0–52.0)
HCT: 31.6 % — ABNORMAL LOW (ref 39.0–52.0)
HCT: 30 % — ABNORMAL LOW (ref 39.0–52.0)
Hemoglobin: 14.6 g/dL (ref 13.0–17.0)
Hemoglobin: 10.4 g/dL — ABNORMAL LOW (ref 13.0–17.0)
Hemoglobin: 10.1 g/dL — ABNORMAL LOW (ref 13.0–17.0)
MCH: 29.4 pg (ref 26.0–34.0)
MCH: 29.6 pg (ref 26.0–34.0)
MCH: 30.2 pg (ref 26.0–34.0)
MCHC: 33.1 g/dL (ref 30.0–36.0)
MCHC: 32.9 g/dL (ref 30.0–36.0)
MCHC: 33.7 g/dL (ref 30.0–36.0)
MCV: 88.9 fL (ref 80.0–100.0)
MCV: 90 fL (ref 80.0–100.0)
MCV: 89.8 fL (ref 80.0–100.0)
Platelets: 339 10*3/uL (ref 150–400)
Platelets: 249 10*3/uL (ref 150–400)
Platelets: 250 10*3/uL (ref 150–400)
RBC: 4.96 MIL/uL (ref 4.22–5.81)
RBC: 3.51 MIL/uL — ABNORMAL LOW (ref 4.22–5.81)
RBC: 3.34 MIL/uL — ABNORMAL LOW (ref 4.22–5.81)
RDW: 12.5 % (ref 11.5–15.5)
RDW: 12.4 % (ref 11.5–15.5)
RDW: 12.5 % (ref 11.5–15.5)
WBC: 10.9 10*3/uL — ABNORMAL HIGH (ref 4.0–10.5)
WBC: 22 10*3/uL — ABNORMAL HIGH (ref 4.0–10.5)
WBC: 18.3 10*3/uL — ABNORMAL HIGH (ref 4.0–10.5)
nRBC: 0 % (ref 0.0–0.2)
nRBC: 0 % (ref 0.0–0.2)
nRBC: 0 % (ref 0.0–0.2)

## 2020-11-09 LAB — GLUCOSE, CAPILLARY
Glucose-Capillary: 105 mg/dL — ABNORMAL HIGH (ref 70–99)
Glucose-Capillary: 130 mg/dL — ABNORMAL HIGH (ref 70–99)
Glucose-Capillary: 105 mg/dL — ABNORMAL HIGH (ref 70–99)
Glucose-Capillary: 109 mg/dL — ABNORMAL HIGH (ref 70–99)
Glucose-Capillary: 122 mg/dL — ABNORMAL HIGH (ref 70–99)
Glucose-Capillary: 125 mg/dL — ABNORMAL HIGH (ref 70–99)
Glucose-Capillary: 115 mg/dL — ABNORMAL HIGH (ref 70–99)
Glucose-Capillary: 136 mg/dL — ABNORMAL HIGH (ref 70–99)

## 2020-11-09 LAB — POCT I-STAT 7, (LYTES, BLD GAS, ICA,H+H)
Acid-Base Excess: 2 mmol/L (ref 0.0–2.0)
Acid-Base Excess: 1 mmol/L (ref 0.0–2.0)
Acid-Base Excess: 2 mmol/L (ref 0.0–2.0)
Acid-Base Excess: 1 mmol/L (ref 0.0–2.0)
Acid-base deficit: 3 mmol/L — ABNORMAL HIGH (ref 0.0–2.0)
Acid-base deficit: 1 mmol/L (ref 0.0–2.0)
Acid-base deficit: 2 mmol/L (ref 0.0–2.0)
Acid-base deficit: 4 mmol/L — ABNORMAL HIGH (ref 0.0–2.0)
Bicarbonate: 28.5 mmol/L — ABNORMAL HIGH (ref 20.0–28.0)
Bicarbonate: 27.3 mmol/L (ref 20.0–28.0)
Bicarbonate: 27.5 mmol/L (ref 20.0–28.0)
Bicarbonate: 26.4 mmol/L (ref 20.0–28.0)
Bicarbonate: 22.7 mmol/L (ref 20.0–28.0)
Bicarbonate: 25.8 mmol/L (ref 20.0–28.0)
Bicarbonate: 25 mmol/L (ref 20.0–28.0)
Bicarbonate: 21.8 mmol/L (ref 20.0–28.0)
Calcium, Ion: 1.27 mmol/L (ref 1.15–1.40)
Calcium, Ion: 0.98 mmol/L — ABNORMAL LOW (ref 1.15–1.40)
Calcium, Ion: 1 mmol/L — ABNORMAL LOW (ref 1.15–1.40)
Calcium, Ion: 1.05 mmol/L — ABNORMAL LOW (ref 1.15–1.40)
Calcium, Ion: 1.12 mmol/L — ABNORMAL LOW (ref 1.15–1.40)
Calcium, Ion: 1.19 mmol/L (ref 1.15–1.40)
Calcium, Ion: 1.18 mmol/L (ref 1.15–1.40)
Calcium, Ion: 1.19 mmol/L (ref 1.15–1.40)
HCT: 41 % (ref 39.0–52.0)
HCT: 27 % — ABNORMAL LOW (ref 39.0–52.0)
HCT: 33 % — ABNORMAL LOW (ref 39.0–52.0)
HCT: 29 % — ABNORMAL LOW (ref 39.0–52.0)
HCT: 28 % — ABNORMAL LOW (ref 39.0–52.0)
HCT: 29 % — ABNORMAL LOW (ref 39.0–52.0)
HCT: 28 % — ABNORMAL LOW (ref 39.0–52.0)
HCT: 30 % — ABNORMAL LOW (ref 39.0–52.0)
Hemoglobin: 13.9 g/dL (ref 13.0–17.0)
Hemoglobin: 11.2 g/dL — ABNORMAL LOW (ref 13.0–17.0)
Hemoglobin: 9.2 g/dL — ABNORMAL LOW (ref 13.0–17.0)
Hemoglobin: 9.9 g/dL — ABNORMAL LOW (ref 13.0–17.0)
Hemoglobin: 9.5 g/dL — ABNORMAL LOW (ref 13.0–17.0)
Hemoglobin: 9.9 g/dL — ABNORMAL LOW (ref 13.0–17.0)
Hemoglobin: 9.5 g/dL — ABNORMAL LOW (ref 13.0–17.0)
Hemoglobin: 10.2 g/dL — ABNORMAL LOW (ref 13.0–17.0)
O2 Saturation: 100 %
O2 Saturation: 100 %
O2 Saturation: 100 %
O2 Saturation: 99 %
O2 Saturation: 97 %
O2 Saturation: 94 %
O2 Saturation: 95 %
O2 Saturation: 96 %
Patient temperature: 37.2
Patient temperature: 38.1
Patient temperature: 38.3
Patient temperature: 38
Potassium: 4.4 mmol/L (ref 3.5–5.1)
Potassium: 4.7 mmol/L (ref 3.5–5.1)
Potassium: 5.3 mmol/L — ABNORMAL HIGH (ref 3.5–5.1)
Potassium: 5.1 mmol/L (ref 3.5–5.1)
Potassium: 4.1 mmol/L (ref 3.5–5.1)
Potassium: 4.9 mmol/L (ref 3.5–5.1)
Potassium: 4.9 mmol/L (ref 3.5–5.1)
Potassium: 5 mmol/L (ref 3.5–5.1)
Sodium: 140 mmol/L (ref 135–145)
Sodium: 137 mmol/L (ref 135–145)
Sodium: 141 mmol/L (ref 135–145)
Sodium: 139 mmol/L (ref 135–145)
Sodium: 142 mmol/L (ref 135–145)
Sodium: 141 mmol/L (ref 135–145)
Sodium: 140 mmol/L (ref 135–145)
Sodium: 140 mmol/L (ref 135–145)
TCO2: 30 mmol/L (ref 22–32)
TCO2: 29 mmol/L (ref 22–32)
TCO2: 29 mmol/L (ref 22–32)
TCO2: 28 mmol/L (ref 22–32)
TCO2: 24 mmol/L (ref 22–32)
TCO2: 27 mmol/L (ref 22–32)
TCO2: 26 mmol/L (ref 22–32)
TCO2: 23 mmol/L (ref 22–32)
pCO2 arterial: 50.2 mmHg — ABNORMAL HIGH (ref 32.0–48.0)
pCO2 arterial: 44.9 mmHg (ref 32.0–48.0)
pCO2 arterial: 49.7 mmHg — ABNORMAL HIGH (ref 32.0–48.0)
pCO2 arterial: 43.9 mmHg (ref 32.0–48.0)
pCO2 arterial: 44.9 mmHg (ref 32.0–48.0)
pCO2 arterial: 56.7 mmHg — ABNORMAL HIGH (ref 32.0–48.0)
pCO2 arterial: 52.8 mmHg — ABNORMAL HIGH (ref 32.0–48.0)
pCO2 arterial: 43.4 mmHg (ref 32.0–48.0)
pH, Arterial: 7.363 (ref 7.350–7.450)
pH, Arterial: 7.347 — ABNORMAL LOW (ref 7.350–7.450)
pH, Arterial: 7.394 (ref 7.350–7.450)
pH, Arterial: 7.388 (ref 7.350–7.450)
pH, Arterial: 7.312 — ABNORMAL LOW (ref 7.350–7.450)
pH, Arterial: 7.272 — ABNORMAL LOW (ref 7.350–7.450)
pH, Arterial: 7.289 — ABNORMAL LOW (ref 7.350–7.450)
pH, Arterial: 7.314 — ABNORMAL LOW (ref 7.350–7.450)
pO2, Arterial: 347 mmHg — ABNORMAL HIGH (ref 83.0–108.0)
pO2, Arterial: 275 mmHg — ABNORMAL HIGH (ref 83.0–108.0)
pO2, Arterial: 356 mmHg — ABNORMAL HIGH (ref 83.0–108.0)
pO2, Arterial: 165 mmHg — ABNORMAL HIGH (ref 83.0–108.0)
pO2, Arterial: 105 mmHg (ref 83.0–108.0)
pO2, Arterial: 85 mmHg (ref 83.0–108.0)
pO2, Arterial: 92 mmHg (ref 83.0–108.0)
pO2, Arterial: 90 mmHg (ref 83.0–108.0)

## 2020-11-09 LAB — BASIC METABOLIC PANEL
Anion gap: 11 (ref 5–15)
Anion gap: 7 (ref 5–15)
BUN: 13 mg/dL (ref 6–20)
BUN: 15 mg/dL (ref 6–20)
CO2: 22 mmol/L (ref 22–32)
CO2: 23 mmol/L (ref 22–32)
Calcium: 9.4 mg/dL (ref 8.9–10.3)
Calcium: 7.9 mg/dL — ABNORMAL LOW (ref 8.9–10.3)
Chloride: 104 mmol/L (ref 98–111)
Chloride: 109 mmol/L (ref 98–111)
Creatinine, Ser: 1.14 mg/dL (ref 0.61–1.24)
Creatinine, Ser: 1.18 mg/dL (ref 0.61–1.24)
GFR, Estimated: >60 mL/min (ref >60)
GFR, Estimated: >60 mL/min (ref >60)
Glucose, Bld: 108 mg/dL — ABNORMAL HIGH (ref 70–99)
Glucose, Bld: 130 mg/dL — ABNORMAL HIGH (ref 70–99)
Potassium: 4.1 mmol/L (ref 3.5–5.1)
Potassium: 5.1 mmol/L (ref 3.5–5.1)
Sodium: 137 mmol/L (ref 135–145)
Sodium: 139 mmol/L (ref 135–145)

## 2020-11-09 LAB — POCT I-STAT EG7
Acid-Base Excess: 2 mmol/L (ref 0.0–2.0)
Bicarbonate: 27.8 mmol/L (ref 20.0–28.0)
Calcium, Ion: 0.99 mmol/L — ABNORMAL LOW (ref 1.15–1.40)
HCT: 27 % — ABNORMAL LOW (ref 39.0–52.0)
Hemoglobin: 9.2 g/dL — ABNORMAL LOW (ref 13.0–17.0)
O2 Saturation: 81 %
Potassium: 4.7 mmol/L (ref 3.5–5.1)
Sodium: 142 mmol/L (ref 135–145)
TCO2: 29 mmol/L (ref 22–32)
pCO2, Ven: 48 mmHg (ref 44.0–60.0)
pH, Ven: 7.372 (ref 7.250–7.430)
pO2, Ven: 47 mmHg — ABNORMAL HIGH (ref 32.0–45.0)

## 2020-11-09 LAB — PROTIME-INR
INR: 1.3 — ABNORMAL HIGH (ref 0.8–1.2)
Prothrombin Time: 15.9 seconds — ABNORMAL HIGH (ref 11.4–15.2)

## 2020-11-09 LAB — HEMOGLOBIN AND HEMATOCRIT, BLOOD
HCT: 29.1 % — ABNORMAL LOW (ref 39.0–52.0)
Hemoglobin: 9.9 g/dL — ABNORMAL LOW (ref 13.0–17.0)

## 2020-11-09 LAB — ECHO INTRAOPERATIVE TEE
AV Mean grad: 3 mmHg
Height: 67 in
Weight: 3668.45 oz

## 2020-11-09 LAB — APTT: aPTT: 33 seconds (ref 24–36)

## 2020-11-09 LAB — MAGNESIUM: Magnesium: 2.8 mg/dL — ABNORMAL HIGH (ref 1.7–2.4)

## 2020-11-09 LAB — PLATELET COUNT: Platelets: 253 10*3/uL (ref 150–400)

## 2020-11-09 SURGERY — CORONARY ARTERY BYPASS GRAFTING (CABG)
Anesthesia: General | Site: Chest

## 2020-11-09 MED ORDER — FENTANYL CITRATE (PF) 250 MCG/5ML IJ SOLN
INTRAMUSCULAR | Status: DC | PRN
  Administered 2020-11-09: 100 ug via INTRAVENOUS
  Administered 2020-11-09: 400 ug via INTRAVENOUS
  Administered 2020-11-09: 500 ug via INTRAVENOUS

## 2020-11-09 MED ORDER — ALBUMIN HUMAN 5 % IV SOLN: INTRAVENOUS | Status: DC | PRN

## 2020-11-09 MED ORDER — PHENYLEPHRINE HCL-NACL 20-0.9 MG/250ML-% IV SOLN
0.0000 ug/min | INTRAVENOUS | Status: DC
  Filled 2020-11-09: qty 250

## 2020-11-09 MED ORDER — SODIUM CHLORIDE 0.9 % IV SOLN
250.0000 mL | INTRAVENOUS | Status: DC
  Administered 2020-11-10: 250 mL via INTRAVENOUS

## 2020-11-09 MED ORDER — BISACODYL 5 MG PO TBEC
10.0000 mg | DELAYED_RELEASE_TABLET | Freq: Every day | ORAL | Status: DC
  Administered 2020-11-11 – 2020-11-13 (×3): 10 mg via ORAL
  Filled 2020-11-09 (×4): qty 2

## 2020-11-09 MED ORDER — LACTATED RINGERS IV SOLN: INTRAVENOUS | Status: DC | PRN

## 2020-11-09 MED ORDER — SODIUM CHLORIDE 0.45 % IV SOLN: INTRAVENOUS | Status: DC | PRN

## 2020-11-09 MED ORDER — ACETAMINOPHEN 500 MG PO TABS
1000.0000 mg | ORAL_TABLET | Freq: Four times a day (QID) | ORAL | Status: DC
  Administered 2020-11-09 – 2020-11-14 (×15): 1000 mg via ORAL
  Filled 2020-11-09 (×15): qty 2

## 2020-11-09 MED ORDER — PLASMA-LYTE 148 IV SOLN
INTRAVENOUS | Status: DC | PRN
  Administered 2020-11-09: 500 mL via INTRAVASCULAR

## 2020-11-09 MED ORDER — MORPHINE SULFATE (PF) 2 MG/ML IV SOLN
1.0000 mg | INTRAVENOUS | Status: DC | PRN
Start: 2020-11-09 — End: 2020-11-14
  Administered 2020-11-09 (×4): 2 mg via INTRAVENOUS
  Filled 2020-11-09 (×4): qty 1

## 2020-11-09 MED ORDER — MAGNESIUM SULFATE 4 GM/100ML IV SOLN
4.0000 g | Freq: Once | INTRAVENOUS | Status: AC
  Administered 2020-11-09: 4 g via INTRAVENOUS
  Filled 2020-11-09: qty 100

## 2020-11-09 MED ORDER — PHENYLEPHRINE 40 MCG/ML (10ML) SYRINGE FOR IV PUSH (FOR BLOOD PRESSURE SUPPORT)
PREFILLED_SYRINGE | INTRAVENOUS | Status: AC
  Filled 2020-11-09: qty 10

## 2020-11-09 MED ORDER — ALBUMIN HUMAN 5 % IV SOLN
250.0000 mL | INTRAVENOUS | Status: AC | PRN
  Administered 2020-11-09: 12.5 g via INTRAVENOUS

## 2020-11-09 MED ORDER — ACETAMINOPHEN 650 MG RE SUPP
650.0000 mg | Freq: Once | RECTAL | Status: AC
  Administered 2020-11-09: 650 mg via RECTAL

## 2020-11-09 MED ORDER — PROPOFOL 10 MG/ML IV BOLUS
INTRAVENOUS | Status: AC
  Filled 2020-11-09: qty 20

## 2020-11-09 MED ORDER — TRAMADOL HCL 50 MG PO TABS
50.0000 mg | ORAL_TABLET | ORAL | Status: DC | PRN
Start: 2020-11-09 — End: 2020-11-14
  Administered 2020-11-10 – 2020-11-11 (×3): 100 mg via ORAL
  Filled 2020-11-09 (×3): qty 2

## 2020-11-09 MED ORDER — SODIUM CHLORIDE 0.9 % IV SOLN
1.5000 g | Freq: Two times a day (BID) | INTRAVENOUS | Status: AC
  Administered 2020-11-09 – 2020-11-11 (×4): 1.5 g via INTRAVENOUS
  Filled 2020-11-09 (×4): qty 1.5

## 2020-11-09 MED ORDER — ASPIRIN EC 325 MG PO TBEC
325.0000 mg | DELAYED_RELEASE_TABLET | Freq: Every day | ORAL | Status: DC
  Administered 2020-11-11: 325 mg via ORAL
  Filled 2020-11-09 (×2): qty 1

## 2020-11-09 MED ORDER — HEPARIN SODIUM (PORCINE) 1000 UNIT/ML IJ SOLN
INTRAMUSCULAR | Status: AC
  Filled 2020-11-09: qty 1

## 2020-11-09 MED ORDER — LACTATED RINGERS IV SOLN: INTRAVENOUS | Status: DC

## 2020-11-09 MED ORDER — SODIUM CHLORIDE 0.9 % IV SOLN: INTRAVENOUS | Status: AC

## 2020-11-09 MED ORDER — BISACODYL 10 MG RE SUPP: 10.0000 mg | Freq: Every day | RECTAL | Status: DC

## 2020-11-09 MED ORDER — CALCIUM CHLORIDE 10 % IV SOLN
INTRAVENOUS | Status: DC | PRN
  Administered 2020-11-09: 100 mg via INTRAVENOUS
  Administered 2020-11-09: 200 mg via INTRAVENOUS

## 2020-11-09 MED ORDER — PROTAMINE SULFATE 10 MG/ML IV SOLN
INTRAVENOUS | Status: DC | PRN
  Administered 2020-11-09: 280 mg via INTRAVENOUS

## 2020-11-09 MED ORDER — SODIUM CHLORIDE 0.9 % IV SOLN: INTRAVENOUS | Status: DC | PRN

## 2020-11-09 MED ORDER — LACTATED RINGERS IV SOLN: 500.0000 mL | Freq: Once | INTRAVENOUS | Status: DC | PRN

## 2020-11-09 MED ORDER — SODIUM CHLORIDE (PF) 0.9 % IJ SOLN
OROMUCOSAL | Status: DC | PRN
  Administered 2020-11-09 (×4): 4 mL via TOPICAL

## 2020-11-09 MED ORDER — PROPOFOL 10 MG/ML IV BOLUS
INTRAVENOUS | Status: DC | PRN
  Administered 2020-11-09: 30 mg via INTRAVENOUS

## 2020-11-09 MED ORDER — PROTAMINE SULFATE 10 MG/ML IV SOLN
INTRAVENOUS | Status: AC
  Filled 2020-11-09: qty 5

## 2020-11-09 MED ORDER — ATORVASTATIN CALCIUM 80 MG PO TABS
80.0000 mg | ORAL_TABLET | Freq: Every day | ORAL | Status: DC
  Administered 2020-11-12 – 2020-11-14 (×3): 80 mg via ORAL
  Filled 2020-11-09 (×3): qty 1

## 2020-11-09 MED ORDER — ONDANSETRON HCL 4 MG/2ML IJ SOLN
4.0000 mg | Freq: Four times a day (QID) | INTRAMUSCULAR | Status: DC | PRN
  Administered 2020-11-10: 4 mg via INTRAVENOUS
  Filled 2020-11-09: qty 2

## 2020-11-09 MED ORDER — ASPIRIN 81 MG PO CHEW
324.0000 mg | CHEWABLE_TABLET | Freq: Every day | ORAL | Status: DC
  Administered 2020-11-10: 324 mg

## 2020-11-09 MED ORDER — SODIUM CHLORIDE 0.9% FLUSH
3.0000 mL | Freq: Two times a day (BID) | INTRAVENOUS | Status: DC
  Administered 2020-11-10 – 2020-11-12 (×3): 3 mL via INTRAVENOUS

## 2020-11-09 MED ORDER — HEPARIN SODIUM (PORCINE) 1000 UNIT/ML IJ SOLN
INTRAMUSCULAR | Status: DC | PRN
  Administered 2020-11-09: 36000 [IU] via INTRAVENOUS

## 2020-11-09 MED ORDER — PHENYLEPHRINE HCL-NACL 10-0.9 MG/250ML-% IV SOLN
INTRAVENOUS | Status: DC | PRN
  Administered 2020-11-09: 50 ug/min via INTRAVENOUS

## 2020-11-09 MED ORDER — METOPROLOL TARTRATE 12.5 MG HALF TABLET
12.5000 mg | ORAL_TABLET | Freq: Two times a day (BID) | ORAL | Status: DC
  Filled 2020-11-09 (×2): qty 1

## 2020-11-09 MED ORDER — PANTOPRAZOLE SODIUM 40 MG PO TBEC
40.0000 mg | DELAYED_RELEASE_TABLET | Freq: Every day | ORAL | Status: DC
  Administered 2020-11-11 – 2020-11-14 (×4): 40 mg via ORAL
  Filled 2020-11-09 (×4): qty 1

## 2020-11-09 MED ORDER — SODIUM CHLORIDE 0.9% FLUSH: 3.0000 mL | INTRAVENOUS | Status: DC | PRN

## 2020-11-09 MED ORDER — ARTIFICIAL TEARS OPHTHALMIC OINT
TOPICAL_OINTMENT | OPHTHALMIC | Status: AC
  Filled 2020-11-09: qty 3.5

## 2020-11-09 MED ORDER — DEXMEDETOMIDINE HCL IN NACL 400 MCG/100ML IV SOLN: 0.0000 ug/kg/h | INTRAVENOUS | Status: DC

## 2020-11-09 MED ORDER — ROCURONIUM BROMIDE 10 MG/ML (PF) SYRINGE
PREFILLED_SYRINGE | INTRAVENOUS | Status: AC
  Filled 2020-11-09: qty 10

## 2020-11-09 MED ORDER — MIDAZOLAM HCL 5 MG/5ML IJ SOLN
INTRAMUSCULAR | Status: DC | PRN
  Administered 2020-11-09: 1 mg via INTRAVENOUS
  Administered 2020-11-09: 5 mg via INTRAVENOUS
  Administered 2020-11-09 (×2): 2 mg via INTRAVENOUS

## 2020-11-09 MED ORDER — VANCOMYCIN HCL 1000 MG IV SOLR
INTRAVENOUS | Status: DC | PRN
  Administered 2020-11-09: 1000 mL

## 2020-11-09 MED ORDER — ACETAMINOPHEN 160 MG/5ML PO SOLN: 1000.0000 mg | Freq: Four times a day (QID) | ORAL | Status: DC

## 2020-11-09 MED ORDER — CHLORHEXIDINE GLUCONATE 0.12 % MT SOLN
15.0000 mL | OROMUCOSAL | Status: AC
  Administered 2020-11-09: 15 mL via OROMUCOSAL

## 2020-11-09 MED ORDER — INSULIN REGULAR(HUMAN) IN NACL 100-0.9 UT/100ML-% IV SOLN
INTRAVENOUS | Status: DC
  Administered 2020-11-09: 0.9 [IU]/h via INTRAVENOUS

## 2020-11-09 MED ORDER — NITROGLYCERIN IN D5W 200-5 MCG/ML-% IV SOLN: 0.0000 ug/min | INTRAVENOUS | Status: DC

## 2020-11-09 MED ORDER — FENTANYL CITRATE (PF) 250 MCG/5ML IJ SOLN
INTRAMUSCULAR | Status: AC
  Filled 2020-11-09: qty 20

## 2020-11-09 MED ORDER — METOPROLOL TARTRATE 5 MG/5ML IV SOLN: 2.5000 mg | INTRAVENOUS | Status: DC | PRN

## 2020-11-09 MED ORDER — ROCURONIUM BROMIDE 10 MG/ML (PF) SYRINGE
PREFILLED_SYRINGE | INTRAVENOUS | Status: DC | PRN
  Administered 2020-11-09: 50 mg via INTRAVENOUS
  Administered 2020-11-09: 70 mg via INTRAVENOUS
  Administered 2020-11-09: 50 mg via INTRAVENOUS
  Administered 2020-11-09: 30 mg via INTRAVENOUS
  Administered 2020-11-09: 50 mg via INTRAVENOUS

## 2020-11-09 MED ORDER — MIDAZOLAM HCL 2 MG/2ML IJ SOLN: 2.0000 mg | INTRAMUSCULAR | Status: DC | PRN

## 2020-11-09 MED ORDER — 0.9 % SODIUM CHLORIDE (POUR BTL) OPTIME
TOPICAL | Status: DC | PRN
  Administered 2020-11-09: 5000 mL

## 2020-11-09 MED ORDER — DOCUSATE SODIUM 100 MG PO CAPS
200.0000 mg | ORAL_CAPSULE | Freq: Every day | ORAL | Status: DC
  Administered 2020-11-10 – 2020-11-13 (×4): 200 mg via ORAL
  Filled 2020-11-09 (×5): qty 2

## 2020-11-09 MED ORDER — ACETAMINOPHEN 160 MG/5ML PO SOLN: 650.0000 mg | Freq: Once | ORAL | Status: AC

## 2020-11-09 MED ORDER — POTASSIUM CHLORIDE 10 MEQ/50ML IV SOLN: 10.0000 meq | INTRAVENOUS | Status: AC

## 2020-11-09 MED ORDER — VANCOMYCIN HCL IN DEXTROSE 1-5 GM/200ML-% IV SOLN
1000.0000 mg | Freq: Once | INTRAVENOUS | Status: AC
  Administered 2020-11-09: 1000 mg via INTRAVENOUS
  Filled 2020-11-09: qty 200

## 2020-11-09 MED ORDER — FAMOTIDINE IN NACL 20-0.9 MG/50ML-% IV SOLN
20.0000 mg | Freq: Two times a day (BID) | INTRAVENOUS | Status: AC
  Administered 2020-11-09: 20 mg via INTRAVENOUS
  Filled 2020-11-09: qty 50

## 2020-11-09 MED ORDER — MIDAZOLAM HCL (PF) 10 MG/2ML IJ SOLN
INTRAMUSCULAR | Status: AC
  Filled 2020-11-09: qty 2

## 2020-11-09 MED ORDER — PROTAMINE SULFATE 10 MG/ML IV SOLN
INTRAVENOUS | Status: AC
  Filled 2020-11-09: qty 25

## 2020-11-09 MED ORDER — ROCURONIUM BROMIDE 10 MG/ML (PF) SYRINGE
PREFILLED_SYRINGE | INTRAVENOUS | Status: AC
  Filled 2020-11-09: qty 20

## 2020-11-09 MED ORDER — PHENYLEPHRINE 40 MCG/ML (10ML) SYRINGE FOR IV PUSH (FOR BLOOD PRESSURE SUPPORT)
PREFILLED_SYRINGE | INTRAVENOUS | Status: DC | PRN
  Administered 2020-11-09 (×2): 120 ug via INTRAVENOUS
  Administered 2020-11-09: 40 ug via INTRAVENOUS
  Administered 2020-11-09: 80 ug via INTRAVENOUS
  Administered 2020-11-09: 40 ug via INTRAVENOUS
  Administered 2020-11-09: 80 ug via INTRAVENOUS
  Administered 2020-11-09: 120 ug via INTRAVENOUS

## 2020-11-09 MED ORDER — CHLORHEXIDINE GLUCONATE CLOTH 2 % EX PADS
6.0000 | MEDICATED_PAD | Freq: Every day | CUTANEOUS | Status: DC
  Administered 2020-11-09 – 2020-11-14 (×6): 6 via TOPICAL

## 2020-11-09 MED ORDER — SODIUM CHLORIDE 0.9 % IV SOLN: INTRAVENOUS | Status: DC

## 2020-11-09 MED ORDER — METOPROLOL TARTRATE 25 MG/10 ML ORAL SUSPENSION: 12.5000 mg | Freq: Two times a day (BID) | ORAL | Status: DC

## 2020-11-09 MED ORDER — DEXTROSE 50 % IV SOLN: 0.0000 mL | INTRAVENOUS | Status: DC | PRN

## 2020-11-09 MED ORDER — OXYCODONE HCL 5 MG PO TABS
5.0000 mg | ORAL_TABLET | ORAL | Status: DC | PRN
  Administered 2020-11-09 – 2020-11-11 (×5): 10 mg via ORAL
  Filled 2020-11-09 (×6): qty 2

## 2020-11-09 SURGICAL SUPPLY — 102 items
BAG DECANTER FOR FLEXI CONT (MISCELLANEOUS) ×6 IMPLANT
BLADE CLIPPER SURG (BLADE) ×3 IMPLANT
BLADE STERNUM SYSTEM 6 (BLADE) ×3 IMPLANT
BLADE SURG 11 STRL SS (BLADE) ×1 IMPLANT
BNDG CMPR MED 10X6 ELC LF (GAUZE/BANDAGES/DRESSINGS) ×2
BNDG ELASTIC 4X5.8 VLCR STR LF (GAUZE/BANDAGES/DRESSINGS) ×3 IMPLANT
BNDG ELASTIC 6X10 VLCR STRL LF (GAUZE/BANDAGES/DRESSINGS) ×1 IMPLANT
BNDG ELASTIC 6X5.8 VLCR STR LF (GAUZE/BANDAGES/DRESSINGS) ×3 IMPLANT
BNDG GAUZE ELAST 4 BULKY (GAUZE/BANDAGES/DRESSINGS) ×3 IMPLANT
CANISTER SUCT 3000ML PPV (MISCELLANEOUS) ×3 IMPLANT
CANNULA EZ GLIDE AORTIC 21FR (CANNULA) ×6 IMPLANT
CATH CPB KIT OWEN (MISCELLANEOUS) ×3 IMPLANT
CATH THORACIC 36FR (CATHETERS) ×3 IMPLANT
CLIP RETRACTION 3.0MM CORONARY (MISCELLANEOUS) ×3 IMPLANT
CLIP VESOCCLUDE MED 24/CT (CLIP) IMPLANT
CLIP VESOCCLUDE SM WIDE 24/CT (CLIP) IMPLANT
CONN ST 1/4X3/8  BEN (MISCELLANEOUS) ×6
CONN ST 1/4X3/8 BEN (MISCELLANEOUS) IMPLANT
DRAIN CHANNEL 32F RND 10.7 FF (WOUND CARE) ×6 IMPLANT
DRAPE CARDIOVASCULAR INCISE (DRAPES) ×3
DRAPE INCISE IOBAN 66X45 STRL (DRAPES) ×3 IMPLANT
DRAPE SLUSH/WARMER DISC (DRAPES) ×3 IMPLANT
DRAPE SRG 135X102X78XABS (DRAPES) ×2 IMPLANT
DRSG AQUACEL AG ADV 3.5X14 (GAUZE/BANDAGES/DRESSINGS) ×3 IMPLANT
ELECT BLADE 4.0 EZ CLEAN MEGAD (MISCELLANEOUS) ×3
ELECT REM PT RETURN 9FT ADLT (ELECTROSURGICAL) ×6
ELECTRODE BLDE 4.0 EZ CLN MEGD (MISCELLANEOUS) ×2 IMPLANT
ELECTRODE REM PT RTRN 9FT ADLT (ELECTROSURGICAL) ×4 IMPLANT
FELT TEFLON 1X6 (MISCELLANEOUS) ×6 IMPLANT
GAUZE SPONGE 4X4 12PLY STRL (GAUZE/BANDAGES/DRESSINGS) ×6 IMPLANT
GLOVE ORTHO TXT STRL SZ7.5 (GLOVE) ×6 IMPLANT
GLOVE SURG SYN 7.5  E (GLOVE) ×6
GLOVE SURG SYN 7.5 E (GLOVE) ×4 IMPLANT
GLOVE SURG SYN 7.5 PF PI (GLOVE) IMPLANT
GLOVE SURG UNDER POLY LF SZ6.5 (GLOVE) ×4 IMPLANT
GOWN STRL REUS W/ TWL LRG LVL3 (GOWN DISPOSABLE) ×8 IMPLANT
GOWN STRL REUS W/TWL LRG LVL3 (GOWN DISPOSABLE) ×21
HEMOSTAT POWDER SURGIFOAM 1G (HEMOSTASIS) ×10 IMPLANT
INSERT FOGARTY XLG (MISCELLANEOUS) ×3 IMPLANT
KIT BASIN OR (CUSTOM PROCEDURE TRAY) ×3 IMPLANT
KIT SUCTION CATH 14FR (SUCTIONS) ×9 IMPLANT
KIT TURNOVER KIT B (KITS) ×3 IMPLANT
KIT VASOVIEW HEMOPRO 2 VH 4000 (KITS) ×3 IMPLANT
LEAD PACING MYOCARDI (MISCELLANEOUS) ×3 IMPLANT
MARKER GRAFT CORONARY BYPASS (MISCELLANEOUS) ×9 IMPLANT
NS IRRIG 1000ML POUR BTL (IV SOLUTION) ×15 IMPLANT
PACK E OPEN HEART (SUTURE) ×3 IMPLANT
PACK OPEN HEART (CUSTOM PROCEDURE TRAY) ×3 IMPLANT
PAD ARMBOARD 7.5X6 YLW CONV (MISCELLANEOUS) ×6 IMPLANT
PAD ELECT DEFIB RADIOL ZOLL (MISCELLANEOUS) ×3 IMPLANT
PENCIL BUTTON HOLSTER BLD 10FT (ELECTRODE) ×3 IMPLANT
POSITIONER HEAD DONUT 9IN (MISCELLANEOUS) ×3 IMPLANT
PUMP SARN DELFIN (MISCELLANEOUS) ×1 IMPLANT
PUNCH AORTIC ROTATE 4.0MM (MISCELLANEOUS) IMPLANT
PUNCH AORTIC ROTATE 4.5MM 8IN (MISCELLANEOUS) ×1 IMPLANT
PUNCH AORTIC ROTATE 5MM 8IN (MISCELLANEOUS) IMPLANT
SET CARDIOPLEGIA MPS 5001102 (MISCELLANEOUS) ×1 IMPLANT
SOL ANTI FOG 6CC (MISCELLANEOUS) IMPLANT
SOLUTION ANTI FOG 6CC (MISCELLANEOUS)
SPONGE LAP 18X18 RF (DISPOSABLE) ×1 IMPLANT
SPONGE LAP 4X18 RFD (DISPOSABLE) ×1 IMPLANT
SUPPORT HEART JANKE-BARRON (MISCELLANEOUS) ×3 IMPLANT
SUT BONE WAX W31G (SUTURE) ×3 IMPLANT
SUT ETHIBOND X763 2 0 SH 1 (SUTURE) ×6 IMPLANT
SUT MNCRL AB 3-0 PS2 18 (SUTURE) ×6 IMPLANT
SUT MNCRL AB 4-0 PS2 18 (SUTURE) ×1 IMPLANT
SUT PDS AB 1 CTX 36 (SUTURE) ×6 IMPLANT
SUT PROLENE 2 0 SH DA (SUTURE) IMPLANT
SUT PROLENE 3 0 SH DA (SUTURE) ×3 IMPLANT
SUT PROLENE 3 0 SH1 36 (SUTURE) IMPLANT
SUT PROLENE 4 0 RB 1 (SUTURE) ×6
SUT PROLENE 4 0 SH DA (SUTURE) IMPLANT
SUT PROLENE 4-0 RB1 .5 CRCL 36 (SUTURE) IMPLANT
SUT PROLENE 5 0 C 1 36 (SUTURE) IMPLANT
SUT PROLENE 6 0 C 1 30 (SUTURE) ×2 IMPLANT
SUT PROLENE 7.0 RB 3 (SUTURE) ×10 IMPLANT
SUT PROLENE 8 0 BV175 6 (SUTURE) ×2 IMPLANT
SUT PROLENE BLUE 7 0 (SUTURE) ×3 IMPLANT
SUT PROLENE POLY MONO (SUTURE) ×1 IMPLANT
SUT SILK  1 MH (SUTURE) ×6
SUT SILK 1 MH (SUTURE) ×2 IMPLANT
SUT STEEL 6MS V (SUTURE) IMPLANT
SUT STEEL STERNAL CCS#1 18IN (SUTURE) IMPLANT
SUT STEEL SZ 6 DBL 3X14 BALL (SUTURE) IMPLANT
SUT VIC AB 1 CTX 36 (SUTURE)
SUT VIC AB 1 CTX36XBRD ANBCTR (SUTURE) IMPLANT
SUT VIC AB 2-0 CT1 27 (SUTURE) ×3
SUT VIC AB 2-0 CT1 TAPERPNT 27 (SUTURE) IMPLANT
SUT VIC AB 2-0 CTX 27 (SUTURE) IMPLANT
SUT VIC AB 3-0 SH 27 (SUTURE)
SUT VIC AB 3-0 SH 27X BRD (SUTURE) IMPLANT
SUT VIC AB 3-0 X1 27 (SUTURE) IMPLANT
SUT VICRYL 4-0 PS2 18IN ABS (SUTURE) IMPLANT
SYSTEM SAHARA CHEST DRAIN ATS (WOUND CARE) ×4 IMPLANT
TAPE CLOTH SURG 4X10 WHT LF (GAUZE/BANDAGES/DRESSINGS) ×1 IMPLANT
TAPE PAPER 2X10 WHT MICROPORE (GAUZE/BANDAGES/DRESSINGS) ×1 IMPLANT
TOWEL GREEN STERILE (TOWEL DISPOSABLE) ×3 IMPLANT
TOWEL GREEN STERILE FF (TOWEL DISPOSABLE) ×3 IMPLANT
TRAY FOLEY SLVR 16FR TEMP STAT (SET/KITS/TRAYS/PACK) ×3 IMPLANT
TUBING LAP HI FLOW INSUFFLATIO (TUBING) ×3 IMPLANT
UNDERPAD 30X36 HEAVY ABSORB (UNDERPADS AND DIAPERS) ×3 IMPLANT
WATER STERILE IRR 1000ML POUR (IV SOLUTION) ×6 IMPLANT

## 2020-11-09 NOTE — Transfer of Care (Signed)
Immediate Anesthesia Transfer of Care Note  Patient: Jorge Molina.  Procedure(s) Performed: CORONARY ARTERY BYPASS GRAFTING (CABG) X THREE , USING LEFT INTERNAL MAMMARY ARTERY AND RIGHT LEG GREATER SAPHENOUS VEIN HARVESTED ENDOSCOPICALLY (N/A Chest) TRANSESOPHAGEAL ECHOCARDIOGRAM (TEE) (N/A )  Patient Location: ICU  Anesthesia Type:General  Level of Consciousness: sedated and unresponsive  Airway & Oxygen Therapy: Patient remains intubated per anesthesia plan and Patient placed on Ventilator (see vital sign flow sheet for setting)  Post-op Assessment: Report given to RN and Post -op Vital signs reviewed and stable  Post vital signs: Reviewed and stable  Last Vitals:  Vitals Value Taken Time  BP 129/75 11/09/20 1249  Temp    Pulse 84 11/09/20 1253  Resp 12 11/09/20 1253  SpO2 100 % 11/09/20 1253  Vitals shown include unvalidated device data.  Last Pain:  Vitals:   11/09/20 0400  TempSrc:   PainSc: 0-No pain      Patients Stated Pain Goal: 0 (11/07/20 1939)  Complications: No complications documented.

## 2020-11-09 NOTE — Progress Notes (Addendum)
Progress Note  Patient Name: Jorge Molina. Date of Encounter: 11/09/2020  [facility] Cardiologist: No primary care provider on file. [place]  Subjective   Just completed multivessel CABG earlier in the morning.  Intubated.   Multivessel CABG by Dr. Cornelius Moras 11/09/2020  Coronary Artery Bypass Grafting x 3              Left Internal Mammary Artery to Distal Left Anterior Descending Coronary Artery             Saphenous Vein Graft to Posterior Descending Coronary Artery             Saphenous Vein Graft to Obtuse Marginal Branch of Left Circumflex Coronary   Inpatient Medications    Scheduled Meds: . acetaminophen  1,000 mg Oral Q6H   Or  . acetaminophen (TYLENOL) oral liquid 160 mg/5 mL  1,000 mg Per Tube Q6H  . acetaminophen (TYLENOL) oral liquid 160 mg/5 mL  650 mg Per Tube Once   Or  . acetaminophen  650 mg Rectal Once  . [START ON 11/10/2020] aspirin EC  325 mg Oral Daily   Or  . [START ON 11/10/2020] aspirin  324 mg Per Tube Daily  . [START ON 11/12/2020] atorvastatin  80 mg Oral Daily  . [START ON 11/10/2020] bisacodyl  10 mg Oral Daily   Or  . [START ON 11/10/2020] bisacodyl  10 mg Rectal Daily  . chlorhexidine  15 mL Mouth/Throat NOW  . [START ON 11/10/2020] docusate sodium  200 mg Oral Daily  . metoprolol tartrate  12.5 mg Oral BID   Or  . metoprolol tartrate  12.5 mg Per Tube BID  . mupirocin ointment  1 application Nasal BID  . [START ON 11/11/2020] pantoprazole  40 mg Oral Daily  . [START ON 11/10/2020] sodium chloride flush  3 mL Intravenous Q12H   Continuous Infusions: . sodium chloride    . sodium chloride    . [START ON 11/10/2020] sodium chloride    . sodium chloride    . albumin human    . cefUROXime (ZINACEF)  IV    . dexmedetomidine (PRECEDEX) IV infusion    . famotidine (PEPCID) IV    . insulin 0.9 Units/hr (11/09/20 1259)  . lactated ringers    . lactated ringers    . lactated ringers    . magnesium sulfate    . nitroGLYCERIN    .  phenylephrine (NEO-SYNEPHRINE) Adult infusion    . potassium chloride    . vancomycin     PRN Meds: sodium chloride, albumin human, dextrose, lactated ringers, metoprolol tartrate, midazolam, morphine injection, ondansetron (ZOFRAN) IV, oxyCODONE, [START ON 11/10/2020] sodium chloride flush, traMADol   Vital Signs    Vitals:   11/09/20 0500 11/09/20 0525 11/09/20 0600 11/09/20 1249  BP:  (!) 128/97 (!) 118/92 129/75  Pulse:  95 83 84  Resp:  18 12 12   Temp:      TempSrc:      SpO2:  95% 96% 99%  Weight: 104 kg     Height:        Intake/Output Summary (Last 24 hours) at 11/09/2020 1304 Last data filed at 11/09/2020 1204 Gross per 24 hour  Intake 4299.27 ml  Output 3346 ml  Net 953.27 ml   Last 3 Weights 11/09/2020 11/08/2020 11/08/2020  Weight (lbs) 229 lb 4.5 oz 233 lb 0.4 oz 233 lb  Weight (kg) 104 kg 105.7 kg 105.688 kg      Telemetry  Sinus rhythm- Personally Reviewed  ECG    Postop tracing demonstrates sinus rhythm with inferior and anterolateral symmetrical T wave inversion.  No ST elevation.  Q waves lead II, 3, aVF, and poor R wave progression V1 through V3..- Personally Reviewed  Physical Exam   Postsurgical apparatus   Labs    High Sensitivity Troponin:   Recent Labs  Lab 11/07/20 0720 11/07/20 0914 11/07/20 1057  TROPONINIHS 741* 6,817* 12,921*      Chemistry Recent Labs  Lab 11/07/20 0720 11/08/20 0139 11/08/20 0301 11/09/20 0526 11/09/20 0802 11/09/20 1005 11/09/20 1026 11/09/20 1045 11/09/20 1126 11/09/20 1129  NA 134* 136   < > 137   < > 140   < > 136 139 139  K 4.1 4.2   < > 4.1   < > 4.8   < > 6.0* 5.2* 5.1  CL 102 106  --  104   < > 100  --  105 102  --   CO2 23 23  --  22  --   --   --   --   --   --   GLUCOSE 136* 110*  --  108*   < > 92  --  132* 125*  --   BUN 14 10  --  13   < > 13  --  13 13  --   CREATININE 1.11 1.05  --  1.14   < > 0.80  --  0.90 0.90  --   CALCIUM 8.9 8.8*  --  9.4  --   --   --   --   --   --   PROT  6.5 6.3*  --   --   --   --   --   --   --   --   ALBUMIN 3.8 3.4*  --   --   --   --   --   --   --   --   AST 32 82*  --   --   --   --   --   --   --   --   ALT 29 36  --   --   --   --   --   --   --   --   ALKPHOS 62 52  --   --   --   --   --   --   --   --   BILITOT 0.5 0.5  --   --   --   --   --   --   --   --   GFRNONAA >60 >60  --  >60  --   --   --   --   --   --   ANIONGAP 9 7  --  11  --   --   --   --   --   --    < > = values in this interval not displayed.     Hematology Recent Labs  Lab 11/07/20 0720 11/08/20 0139 11/08/20 0301 11/09/20 0526 11/09/20 0802 11/09/20 1041 11/09/20 1045 11/09/20 1126 11/09/20 1129  WBC 9.7 11.0*  --  10.9*  --   --   --   --   --   RBC 4.49 4.30  --  4.96  --   --   --   --   --   HGB 13.4 12.9*   < > 14.6   < >  9.9* 9.2* 8.8* 9.9*  HCT 39.7 39.6   < > 44.1   < > 29.1* 27.0* 26.0* 29.0*  MCV 88.4 92.1  --  88.9  --   --   --   --   --   MCH 29.8 30.0  --  29.4  --   --   --   --   --   MCHC 33.8 32.6  --  33.1  --   --   --   --   --   RDW 12.3 12.7  --  12.5  --   --   --   --   --   PLT 305 288  --  339  --  253  --   --   --    < > = values in this interval not displayed.    BNPNo results for input(s): BNP, PROBNP in the last 168 hours.   DDimer No results for input(s): DDIMER in the last 168 hours.   Radiology    DG Chest Port 1 View  Result Date: 11/08/2020 CLINICAL DATA:  Chest pain.  Preoperative exam. EXAM: PORTABLE CHEST 1 VIEW COMPARISON:  04/22/2001. FINDINGS: Mediastinum hilar structures normal. Cardiomegaly. No pulmonary venous congestion. Low lung volumes. No focal infiltrate. No pleural effusion or pneumothorax. Degenerative change thoracic spine. IMPRESSION: 1. Cardiomegaly. No pulmonary venous congestion. 2. Low lung volumes. Electronically Signed   By: Maisie Fus  Register   On: 11/08/2020 07:15   VAS US DOPPLER PRE CABG  Result Date: 11/08/2020 PREOPERATIVE VASCULAR EVALUATION  Indications:      Pre-CABG.  Risk Factors:     Hypertension. Comparison Study: No prior studies. Performing Technologist: Olen Cordial RVT  Examination Guidelines: A complete evaluation includes B-mode imaging, spectral Doppler, color Doppler, and power Doppler as needed of all accessible portions of each vessel. Bilateral testing is considered an integral part of a complete examination. Limited examinations for reoccurring indications may be performed as noted.  Right Carotid Findings: +----------+--------+--------+--------+-----------------------+--------+           PSV cm/sEDV cm/sStenosisDescribe               Comments +----------+--------+--------+--------+-----------------------+--------+ CCA Prox  86      24              smooth and heterogenous         +----------+--------+--------+--------+-----------------------+--------+ CCA Distal91      26              smooth and heterogenous         +----------+--------+--------+--------+-----------------------+--------+ ICA Prox  72      28              smooth and heterogenous         +----------+--------+--------+--------+-----------------------+--------+ ICA Distal89      39                                     tortuous +----------+--------+--------+--------+-----------------------+--------+ ECA       101     18                                              +----------+--------+--------+--------+-----------------------+--------+ Portions of this table do not appear on this page. +----------+--------+-------+--------+------------+  PSV cm/sEDV cmsDescribeArm Pressure +----------+--------+-------+--------+------------+ Subclavian102                                 +----------+--------+-------+--------+------------+ +---------+--------+--+--------+-+---------+ VertebralPSV cm/s20EDV cm/s8Antegrade +---------+--------+--+--------+-+---------+ Left Carotid Findings:  +----------+--------+--------+--------+-----------------------+--------+           PSV cm/sEDV cm/sStenosisDescribe               Comments +----------+--------+--------+--------+-----------------------+--------+ CCA Prox  103     27              smooth and heterogenous         +----------+--------+--------+--------+-----------------------+--------+ CCA Distal86      27              smooth and heterogenous         +----------+--------+--------+--------+-----------------------+--------+ ICA Prox  64      14              smooth and heterogenoustortuous +----------+--------+--------+--------+-----------------------+--------+ ICA Distal65      33                                     tortuous +----------+--------+--------+--------+-----------------------+--------+ ECA       78      14                                              +----------+--------+--------+--------+-----------------------+--------+ +----------+--------+--------+--------+------------+ SubclavianPSV cm/sEDV cm/sDescribeArm Pressure +----------+--------+--------+--------+------------+           98                                   +----------+--------+--------+--------+------------+ +---------+--------+--+--------+--+---------+ VertebralPSV cm/s49EDV cm/s20Antegrade +---------+--------+--+--------+--+---------+  ABI Findings: +--------+------------------+-----+---------+--------+ Right   Rt Pressure (mmHg)IndexWaveform Comment  +--------+------------------+-----+---------+--------+ [id number]                    triphasic         +--------+------------------+-----+---------+--------+ PTA     163               1.28 triphasic         +--------+------------------+-----+---------+--------+ DP      151               1.19 triphasic         +--------+------------------+-----+---------+--------+ +--------+------------------+-----+---------+-------+ Left    Lt Pressure  (mmHg)IndexWaveform Comment +--------+------------------+-----+---------+-------+ [id number]                    triphasic        +--------+------------------+-----+---------+-------+ PTA     157               1.24 triphasic        +--------+------------------+-----+---------+-------+ DP      171               1.35 triphasic        +--------+------------------+-----+---------+-------+ +-------+---------------+----------------+ ABI/TBIToday's ABI/TBIPrevious ABI/TBI +-------+---------------+----------------+ Right  1.28                            +-------+---------------+----------------+ Left   1.35                            +-------+---------------+----------------+  Right Doppler Findings: +--------+--------+-----+---------+--------+ Site    PressureIndexDoppler  Comments +--------+--------+-----+---------+--------+ [id number]          triphasic         +--------+--------+-----+---------+--------+ Radial               triphasic         +--------+--------+-----+---------+--------+ Ulnar                triphasic         +--------+--------+-----+---------+--------+  Left Doppler Findings: +--------+--------+-----+---------+--------+ Site    PressureIndexDoppler  Comments +--------+--------+-----+---------+--------+ [id number]          triphasic         +--------+--------+-----+---------+--------+ Radial               triphasic         +--------+--------+-----+---------+--------+ Ulnar                triphasic         +--------+--------+-----+---------+--------+  Summary: Right Carotid: Velocities in the right ICA are consistent with a 1-39% stenosis. Left Carotid: Velocities in the left ICA are consistent with a 1-39% stenosis. Vertebrals: Bilateral vertebral arteries demonstrate antegrade flow. Right ABI: Resting right ankle-brachial index is within normal range. No evidence of significant right lower extremity arterial disease.  Left ABI: Resting left ankle-brachial index indicates noncompressible left lower extremity arteries. Right Upper Extremity: Doppler waveforms remain within normal limits with right radial compression. Doppler waveforms remain within normal limits with right ulnar compression. Left Upper Extremity: Doppler waveform obliterate with left radial compression. Doppler waveform obliterate with left ulnar compression.  Electronically signed by Sherald Hess MD on 11/08/2020 at 1:06:41 PM.    Final     Cardiac Studies   Cardiac cath and PCI 11/07/2020 Diagnostic Dominance: Right        Intervention      ECHOCARDIOGRAM 11/07/2020  Patient Profile     60 y.o. male with inferolateral STEMI, presenting in transfer from [facility].  Found to have severe multivessel disease with subtotal occlusion of LAD and acute thrombotic occlusion of RCA.  RCA was treated with angioplasty only.  Plan coronary artery bypass grafting.  Patient was loaded with clopidogrel 600 mg single dose at [facility] emergency room.  Assessment & Plan    2. CAD: post CABG.  Maintaining sinus rhythm. 3. Inferior STEMI: Treated with PTCA.  Postop EKG demonstrates inferior Q waves with diffuse symmetrical T wave inversion 2, 3, aVF, and V3 through V 6. 4. Hyperlipidemia, LDL target less than 70: Untreated 5. Prediabetes: Diet  For questions or updates, please contact [facility] Please consult [email or web address] for contact info under        Signed, Lesleigh Noe, MD  11/09/2020, 1:04 PM

## 2020-11-09 NOTE — Progress Notes (Signed)
EVENING ROUNDS NOTE :     [address]       [place] [address]             [phone]                 Day of Surgery Procedure(s) (LRB): CORONARY ARTERY BYPASS GRAFTING (CABG) X THREE , USING LEFT INTERNAL MAMMARY ARTERY AND RIGHT LEG GREATER SAPHENOUS VEIN HARVESTED ENDOSCOPICALLY (N/A) TRANSESOPHAGEAL ECHOCARDIOGRAM (TEE) (N/A)   Total Length of Stay:  LOS: 2 days  Events:   No events Extubated Good hemodynamics    BP 92/60   Pulse 95   Temp (!) 100.94 F (38.3 C)   Resp (!) 22   Ht 5\' 7"  (1.702 m)   Wt 104 kg   SpO2 97%   BMI 35.91 kg/m   PAP: (30-68)/(14-23) 40/23 CO:  [3.4 L/min-5.7 L/min] 5.7 L/min CI:  [1.6 L/min/m2-2.6 L/min/m2] 2.6 L/min/m2  Vent Mode: SIMV;PSV;PRVC FiO2 (%):  [40 %-50 %] 40 % Set Rate:  [4 bmp-12 bmp] 4 bmp Vt Set:  [530 mL] 530 mL PEEP:  [5 cmH20] 5 cmH20 Pressure Support:  [10 cmH20] 10 cmH20 Plateau Pressure:  [18 cmH20] 18 cmH20  . sodium chloride    . sodium chloride 100 mL/hr at 11/09/20 1600  . [START ON 11/10/2020] sodium chloride    . sodium chloride 20 mL/hr at 11/09/20 1250  . albumin human 12.5 g (11/09/20 1344)  . cefUROXime (ZINACEF)  IV    . dexmedetomidine (PRECEDEX) IV infusion 0.7 mcg/kg/hr (11/09/20 1250)  . famotidine (PEPCID) IV Stopped (11/09/20 1356)  . insulin Stopped (11/09/20 1555)  . lactated ringers    . lactated ringers    . lactated ringers    . magnesium sulfate 20 mL/hr at 11/09/20 1600  . nitroGLYCERIN 0 mcg/min (11/09/20 1250)  . phenylephrine (NEO-SYNEPHRINE) Adult infusion 60 mcg/min (11/09/20 1600)  . vancomycin      I/O last 3 completed shifts: In: 1280.3 [P.O.:487; I.V.:793.3] Out: 4790 [Urine:4790]   CBC Latest Ref Rng & Units 11/09/2020 11/09/2020 11/09/2020  WBC 4.0 - 10.5 K/uL 22.0(H) - -  Hemoglobin 13.0 - 17.0 g/dL 10.4(L) 9.9(L) 8.8(L)  Hematocrit 39.0 - 52.0 % 31.6(L) 29.0(L) 26.0(L)  Platelets 150 - 400 K/uL 249 - -    BMP Latest Ref Rng & Units  11/09/2020 11/09/2020 11/09/2020  Glucose 70 - 99 mg/dL - 11/11/2020) 226(J)  BUN 6 - 20 mg/dL - 13 13  Creatinine 335(K - 1.24 mg/dL - 5.62 5.63  Sodium 8.93 - 145 mmol/L 139 139 136  Potassium 3.5 - 5.1 mmol/L 5.1 5.2(H) 6.0(H)  Chloride 98 - 111 mmol/L - 102 105  CO2 22 - 32 mmol/L - - -  Calcium 8.9 - 10.3 mg/dL - - -    ABG    Component Value Date/Time   PHART 7.388 11/09/2020 1129   PCO2ART 43.9 11/09/2020 1129   PO2ART 165 (H) 11/09/2020 1129   HCO3 26.4 11/09/2020 1129   TCO2 28 11/09/2020 1129   O2SAT 99.0 11/09/2020 1129       11/11/2020, MD 11/09/2020 4:23 PM

## 2020-11-09 NOTE — Discharge Instructions (Signed)

## 2020-11-09 NOTE — Anesthesia Procedure Notes (Addendum)
Arterial Line Insertion Start/End3/25/2022 7:00 AM, 11/09/2020 7:23 AM Performed by: Jairo Ben, MD, Marena Chancy, CRNA, anesthesiologist  Patient location: Pre-op. Preanesthetic checklist: patient identified, IV checked, risks and benefits discussed, surgical consent, monitors and equipment checked, pre-op evaluation, timeout performed and anesthesia consent Lidocaine 1% used for infiltration and patient sedated Left, radial was placed Catheter size: 20 G Hand hygiene performed  and maximum sterile barriers used   Attempts: 2 Procedure performed using ultrasound guided technique. Ultrasound Notes:anatomy identified, needle tip was noted to be adjacent to the nerve/plexus identified, no ultrasound evidence of intravascular and/or intraneural injection and image(s) printed for medical record Following insertion, dressing applied and Biopatch. Post procedure assessment: normal  Patient tolerated the procedure well with no immediate complications. Additional procedure comments: Jean Rosenthal, MD.

## 2020-11-09 NOTE — Anesthesia Postprocedure Evaluation (Signed)
Anesthesia Post Note  Patient: Jorge Molina.  Procedure(s) Performed: CORONARY ARTERY BYPASS GRAFTING (CABG) X THREE , USING LEFT INTERNAL MAMMARY ARTERY AND RIGHT LEG GREATER SAPHENOUS VEIN HARVESTED ENDOSCOPICALLY (N/A Chest) TRANSESOPHAGEAL ECHOCARDIOGRAM (TEE) (N/A )     Patient location during evaluation: SICU Anesthesia Type: General Level of consciousness: awake and alert, patient cooperative and oriented Pain management: pain level controlled Vital Signs Assessment: post-procedure vital signs reviewed and stable Respiratory status: spontaneous breathing, nonlabored ventilation, respiratory function stable and patient connected to nasal cannula oxygen (recently extubated and doing well on  O2) Cardiovascular status: stable (requiring phenylephrine support) Postop Assessment: no apparent nausea or vomiting Anesthetic complications: no   No complications documented.  Last Vitals:  Vitals:   11/09/20 1347 11/09/20 1400  BP:  93/72  Pulse:  92  Resp:  (!) 26  Temp:  37.7 C  SpO2: 100% 92%    Last Pain:  Vitals:   11/09/20 1450  TempSrc:   PainSc: 9                  Tinsley Lomas,E. Antionetta Ator

## 2020-11-09 NOTE — Anesthesia Procedure Notes (Signed)
Central Venous Catheter Insertion Performed by: Annye Asa, MD, anesthesiologist Start/End3/25/2022 6:51 AM, 11/09/2020 7:09 AM Preanesthetic checklist: patient identified, IV checked, risks and benefits discussed, surgical consent, monitors and equipment checked, pre-op evaluation, timeout performed and anesthesia consent Position: supine Lidocaine 1% used for infiltration and patient sedated Hand hygiene performed , maximum sterile barriers used  and Seldinger technique used Catheter size: 8.5 Fr PA cath was placed.Sheath introducer Swan type:thermodilution Procedure performed using ultrasound guided technique. Ultrasound Notes:anatomy identified, needle tip was noted to be adjacent to the nerve/plexus identified, no ultrasound evidence of intravascular and/or intraneural injection and image(s) printed for medical record Attempts: 1 Following insertion, Biopatch, dressing applied and line sutured. Post procedure assessment: blood return through all ports, free fluid flow and no air  Patient tolerated the procedure well with no immediate complications. Additional procedure comments: PA catheter:  Routine monitors. Timeout, sterile prep, drape, FBP R neck.  Supine position.  1% Lido local, finder and trocar RIJ 1st pass with US guidance.  Cordis placed over J wire. PA catheter in easily.  Sterile dressing applied.  Patient tolerated well, VSS.  Jenita Seashore, MD.

## 2020-11-09 NOTE — Hospital Course (Addendum)
History of Present Illness:  Patient is a 60 year old obese male with no previous history of coronary artery disease but multiple risk factors notable for history of hypertension, hyperlipidemia, remote tobacco use, and a strong family history of coronary artery disease.  He describes a 20-month history of classical symptoms of exertional angina culminating in his acute presentation early this morning with acute inferior wall ST segment elevation myocardial infarction.  He was taken directly to the cardiac Cath Lab by Dr. Excell Seltzer where he underwent primary balloon angioplasty of the culprit right coronary artery lesion.  Additional findings performed at the time of catheterization revealed severe left main coronary artery stenosis with subtotal occlusion of the mid left anterior descending coronary artery but left the left collateral filling of the distal left anterior descending coronary artery.  Following primary balloon angioplasty of the right coronary artery the patient immediately stabilized with resolution of his symptoms of chest pain.  Left ventricular end-diastolic pressure was normal.  There was moderate segmental left ventricular systolic dysfunction with ejection fraction estimated 45% including significant anterolateral hypokinesis suggestive of remote history of previous anterolateral wall myocardial infarction in the distant past.  At present the patient remains pain-free and without any shortness of breath.  Of note, the patient received a loading dose of 600 mg clopidogrel at the time of his presentation to [facility] in [place].   I have personally reviewed the patient's diagnostic cardiac catheterization and discussed findings with Dr. Excell Seltzer.  We both agree that patient would best be treated with surgical revascularization.  Presuming that the patient continues to remain stable we will tentatively plan to proceed with coronary artery bypass grafting on Friday, November 09, 2020. I have  reviewed the indications, risks, and potential benefits of coronary artery bypass grafting with the patient and his sister at the bedside.  Alternative treatment strategies have been discussed, including the relative risks, benefits and long term prognosis associated with medical therapy, percutaneous coronary intervention, and surgical revascularization.  The patient understands and accepts all potential associated risks of surgery including but not limited to risk of death, stroke or other neurologic complication, myocardial infarction, congestive heart failure, respiratory failure, renal failure, bleeding requiring blood transfusion and/or reexploration, aortic dissection or other major vascular complication, arrhythmia, heart block or bradycardia requiring permanent pacemaker, pneumonia, pleural effusion, wound infection, pulmonary embolus or other thromboembolic complication, chronic pain or other delayed complications related to median sternotomy, or the late recurrence of symptomatic ischemic heart disease and/or congestive heart failure.  The importance of long term risk modification have been emphasized.  All questions answered.  Hospital Course:  Mr. Lawhorn remained stable following left heart catheterization.  He had no further chest pain.  The echo confirmed ejection fraction of 45 to 50%.  There were no significant structural or functional valvular abnormalities.  He was taken to the operating room on 11/09/2020 where three-vessel coronary bypass grafting was accomplished without complication.  Left internal mammary artery was grafted to the left anterior descending coronary artery and saphenous veins were grafted to the posterior descending and obtuse marginal coronary arteries.  Following the procedure, he separated from cardiopulmonary bypass without difficulty.  He was transferred to the surgical ICU in stable condition.  The patient was extubated the evening of surgery without difficulty. His  arterial lines, swan ganz catheter and pacing wires were removed without difficulty on POD #1.  He was maintaining NSR and was stable for transfer to the progressive care unit on 11/11/2020.

## 2020-11-09 NOTE — Op Note (Signed)
CARDIOTHORACIC SURGERY OPERATIVE NOTE  Date of Procedure: 11/09/2020  Preoperative Diagnosis:   Severe 3-vessel Coronary Artery Disease  S/P Acute ST-segment Elevation Myocardial Infarction  Postoperative Diagnosis: Same  Procedure:    Coronary Artery Bypass Grafting x 3   Left Internal Mammary Artery to Distal Left Anterior Descending Coronary Artery  Saphenous Vein Graft to Posterior Descending Coronary Artery  Saphenous Vein Graft to Obtuse Marginal Branch of Left Circumflex Coronary Artery  Endoscopic Vein Harvest from Right Thigh   Surgeon: Salvatore Decent. Cornelius Moras, MD  Assistant: Jillyn Hidden, PA-C  Anesthesia: Germaine Pomfret, MD  Operative Findings:  Mild left ventricular systolic dysfunction with inferior wall hypokinesis  Good quality left internal mammary artery conduit  Good quality saphenous vein conduit  Good quality target vessels for grafting    BRIEF CLINICAL NOTE AND INDICATIONS FOR SURGERY  Patient is a 60 year old obese male with no previous history of coronary artery disease but multiple risk factors notable for history of hypertension, hyperlipidemia, remote tobacco use, and a strong family history of coronary artery disease.  He describes a 10-month history of classical symptoms of exertional angina culminating in his acute presentation early this morning with acute inferior wall ST segment elevation myocardial infarction.  He was taken directly to the cardiac Cath Lab by Dr. Excell Seltzer where he underwent primary balloon angioplasty of the culprit right coronary artery lesion.  Additional findings performed at the time of catheterization revealed severe left main coronary artery stenosis with subtotal occlusion of the mid left anterior descending coronary artery but left the left collateral filling of the distal left anterior descending coronary artery.  Following primary balloon angioplasty of the right coronary artery the patient immediately stabilized  with resolution of his symptoms of chest pain.  Left ventricular end-diastolic pressure was normal.  There was moderate segmental left ventricular systolic dysfunction with ejection fraction estimated 45% including significant anterolateral hypokinesis suggestive of remote history of previous anterolateral wall myocardial infarction in the distant past.  The patient has been seen in consultation and counseled at length regarding the indications, risks and potential benefits of surgery.  All questions have been answered, and the patient provides full informed consent for the operation as described.    DETAILS OF THE OPERATIVE PROCEDURE  Preparation:  The patient is brought to the operating room on the above mentioned date and central monitoring was established by the anesthesia team including placement of Swan-Ganz catheter and radial arterial line. The patient is placed in the supine position on the operating table.  Intravenous antibiotics are administered. General endotracheal anesthesia is induced uneventfully. A Foley catheter is placed.  Baseline transesophageal echocardiogram was performed.  Findings were notable for mild left ventricular systolic dysfunction with inferior wall hypokinesis.  There was trace central aortic insufficiency and mild central mitral regurgitation.  The patient's chest, abdomen, both groins, and both lower extremities are prepared and draped in a sterile manner. A time out procedure is performed.   Surgical Approach and Conduit Harvest:  A median sternotomy incision was performed and the left internal mammary artery is dissected from the chest wall and prepared for bypass grafting. The left internal mammary artery is notably good quality conduit. Simultaneously, the greater saphenous vein is obtained from the patient's right thigh using endoscopic vein harvest technique. The saphenous vein is notably good quality conduit. After removal of the saphenous vein, the  small surgical incisions in the lower extremity are closed with absorbable suture. Following systemic heparinization, the left internal mammary artery  was transected distally noted to have excellent flow.   Extracorporeal Cardiopulmonary Bypass and Myocardial Protection:  The pericardium is opened. The ascending aorta is normal in appearance. The ascending aorta and the right atrium are cannulated for cardiopulmonary bypass.  Adequate heparinization is verified.  The entire pre-bypass portion of the operation was notable for stable hemodynamics.  Cardiopulmonary bypass was begun and the surface of the heart is inspected. Distal target vessels are selected for coronary artery bypass grafting. A cardioplegia cannula is placed in the ascending aorta.  A temperature probe was placed in the interventricular septum.  The patient is allowed to cool passively to [facility] systemic temperature.  The aortic cross clamp is applied and cold blood cardioplegia is delivered initially in an antegrade fashion through the aortic root. Iced saline slush is applied for topical hypothermia.  The initial cardioplegic arrest is rapid with early diastolic arrest.  Repeat doses of cardioplegia are administered intermittently throughout the entire cross clamp portion of the operation through the aortic root, and through subsequently placed vein grafts in order to maintain completely flat electrocardiogram and septal myocardial temperature below 15C.  Myocardial protection was felt to be excellent.   Coronary Artery Bypass Grafting:   The posterior descending branch of the right coronary artery was grafted using a reversed saphenous vein graft in an end-to-side fashion.  At the site of distal anastomosis the target vessel was good quality and measured approximately 2.0 mm in diameter.  The obtuse marginal branch of the left circumflex coronary artery was grafted using a reversed saphenous vein graft in an end-to-side fashion.   At the site of distal anastomosis the target vessel was good quality and measured approximately 2.2 mm in diameter.  The distal left anterior coronary artery was grafted with the left internal mammary artery in an end-to-side fashion.  At the site of distal anastomosis the target vessel was good quality and measured approximately 2.0 mm in diameter.  All proximal vein graft anastomoses were placed directly to the ascending aorta prior to removal of the aortic cross clamp.  The septal myocardial temperature rose rapidly after reperfusion of the left internal mammary artery graft.  The aortic cross clamp was removed after a total cross clamp time of 66 minutes.   Procedure Completion:  All proximal and distal coronary anastomoses were inspected for hemostasis and appropriate graft orientation. Epicardial pacing wires are fixed to the right ventricular outflow tract and to the right atrial appendage. The patient is rewarmed to 37C temperature. The patient is weaned and disconnected from cardiopulmonary bypass.  The patient's rhythm at separation from bypass was sinus.  The patient was weaned from cardiopulmonary bypass without any inotropic support. Total cardiopulmonary bypass time for the operation was 81 minutes.  Followup transesophageal echocardiogram performed after separation from bypass revealed no changes from the preoperative exam.  The aortic and venous cannula were removed uneventfully. Protamine was administered to reverse the anticoagulation. The mediastinum and pleural space were inspected for hemostasis and irrigated with saline solution. The mediastinum and both pleural spaces were drained using 4 chest tubes placed through separate stab incisions inferiorly.  The soft tissues anterior to the aorta were reapproximated loosely. The sternum is closed with double strength sternal wire. The soft tissues anterior to the sternum were closed in multiple layers and the skin is closed with a  running subcuticular skin closure.  The post-bypass portion of the operation was notable for stable rhythm and hemodynamics.  No blood products were administered during the  operation.   Disposition:  The patient tolerated the procedure well and is transported to the surgical intensive care in stable condition. There are no intraoperative complications. All sponge instrument and needle counts are verified correct at completion of the operation.    Salvatore Decent. Cornelius Moras MD 11/09/2020 12:27 PM

## 2020-11-09 NOTE — Progress Notes (Signed)
      [address]       [place] [address]             [phone]     CARDIOTHORACIC SURGERY PROGRESS NOTE  Subjective: Jorge Molina. has been scheduled for CORONARY ARTERY BYPASS GRAFTING today.   Objective: Vital signs in last 24 hours: Temp:  [97.6 F (36.4 C)-99 F (37.2 C)] 97.6 F (36.4 C) (03/25 0300) Pulse Rate:  [73-110] 95 (03/25 0525) Resp:  [10-30] 18 (03/25 0525) BP: (102-136)/(69-97) 128/97 (03/25 0525) SpO2:  [89 %-99 %] 95 % (03/25 0525) Weight:  [104 kg-105.7 kg] 104 kg (03/25 0500)  Physical Exam: Unchanged from previously   Intake/Output from previous day: 03/24 0701 - 03/25 0700 In: 853.4 [P.O.:487; I.V.:366.4] Out: 3315 [Urine:3315] Intake/Output this shift: Total I/O In: 135.9 [I.V.:135.9] Out: 1675 [Urine:1675]  Lab Results: Recent Labs    11/07/20 0720 11/08/20 0139 11/08/20 0301  WBC 9.7 11.0*  --   HGB 13.4 12.9* 12.2*  HCT 39.7 39.6 36.0*  PLT 305 288  --    BMET:  Recent Labs    11/07/20 0720 11/08/20 0139 11/08/20 0301  NA 134* 136 138  K 4.1 4.2 4.1  CL 102 106  --   CO2 23 23  --   GLUCOSE 136* 110*  --   BUN 14 10  --   CREATININE 1.11 1.05  --   CALCIUM 8.9 8.8*  --     CBG (last 3)  No results for input(s): GLUCAP in the last 72 hours. PT/INR:   Recent Labs    11/07/20 0720  LABPROT 12.9  INR 1.0    Assessment/Plan:   The various methods of treatment have been discussed with the patient. After consideration of the risks, benefits and treatment options the patient has consented to the planned procedure.   The patient has been seen and labs reviewed. There are no changes in the patient's condition to prevent proceeding with the planned procedure today.   Purcell Nails, MD 11/09/2020 6:09 AM

## 2020-11-09 NOTE — Brief Op Note (Signed)
11/07/2020 - 11/09/2020  10:47 AM  PATIENT:  Jorge Molina.  60 y.o. male  PRE-OPERATIVE DIAGNOSIS:  LEFT MAIN AND MULTI-VESSEL CORONARY ARTERY DISEASE, ACUTE STEMI  POST-OPERATIVE DIAGNOSIS:   LEFT MAIN AND MULTI-VESSEL CORONARY ARTERY DISEASE, ACUTE STEMI   PROCEDURE:   -CORONARY ARTERY BYPASS GRAFTING (CABG) X THREE , USING LEFT   INTERNAL MAMMARY ARTERY AND RIGHT LEG GREATER SAPHENOUS VEIN   HARVESTED ENDOSCOPICALLY     LIMA->LAD  SVG->OM  SVG->PDA   Vein Harvest Time:    Vein Prep Time: -TRANSESOPHAGEAL ECHOCARDIOGRAM   SURGEON:  Purcell Nails, MD - Primary  PHYSICIAN ASSISTANT: Pellegrino Kennard  ANESTHESIA:   general  EBL:  Per anesthesia and perfusion records   BLOOD ADMINISTERED:none  DRAINS: Left pleural and mediastinal drains   LOCAL MEDICATIONS USED:  NONE  SPECIMEN:  No Specimen  DISPOSITION OF SPECIMEN:  N/A  COUNTS:  Correct  DICTATION: .Dragon Dictation  PLAN OF CARE: Admit to inpatient   PATIENT DISPOSITION:  ICU - intubated and hemodynamically stable.   Delay start of Pharmacological VTE agent (>24hrs) due to surgical blood loss or risk of bleeding: yes

## 2020-11-09 NOTE — Procedures (Signed)
Extubation Procedure Note  Patient Details:   Name: Jorge Molina. DOB: [birth year] MRN: [id number]   Airway Documentation:    Vent end date: 11/09/20 Vent end time: 1445   Evaluation  O2 sats: stable throughout  Complications: No apparent complications Patient did tolerate procedure well. Bilateral Breath Sounds: Clear   Yes    Pt extubated to 4L [place]. Positive cuff leak noted, no stridor heard at this time. Vitals stable throughout. NIF -35 VC 830 ml. RT will continue to monitor.   [name] 11/09/2020, 2:49 PM

## 2020-11-09 NOTE — Anesthesia Procedure Notes (Signed)
Procedure Name: Intubation Date/Time: 11/09/2020 7:52 AM Performed by: Marena Chancy, CRNA Pre-anesthesia Checklist: Patient identified, Emergency Drugs available, Suction available and Patient being monitored Patient Re-evaluated:Patient Re-evaluated prior to induction Oxygen Delivery Method: Circle System Utilized Preoxygenation: Pre-oxygenation with 100% oxygen Induction Type: IV induction Ventilation: Oral airway inserted - appropriate to patient size and Two handed mask ventilation required Laryngoscope Size: Glidescope and 4 Grade View: Grade I Tube type: Oral Number of attempts: 1 Airway Equipment and Method: Stylet and Oral airway Placement Confirmation: ETT inserted through vocal cords under direct vision,  positive ETCO2 and breath sounds checked- equal and bilateral Tube secured with: Tape Dental Injury: Teeth and Oropharynx as per pre-operative assessment  Difficulty Due To: Difficult Airway- due to limited oral opening

## 2020-11-10 ENCOUNTER — Inpatient Hospital Stay (HOSPITAL_COMMUNITY): Payer: 59

## 2020-11-10 LAB — BASIC METABOLIC PANEL
Anion gap: 9 (ref 5–15)
Anion gap: 7 (ref 5–15)
BUN: 16 mg/dL (ref 6–20)
BUN: 20 mg/dL (ref 6–20)
CO2: 22 mmol/L (ref 22–32)
CO2: 24 mmol/L (ref 22–32)
Calcium: 8 mg/dL — ABNORMAL LOW (ref 8.9–10.3)
Calcium: 8.2 mg/dL — ABNORMAL LOW (ref 8.9–10.3)
Chloride: 106 mmol/L (ref 98–111)
Chloride: 102 mmol/L (ref 98–111)
Creatinine, Ser: 1.1 mg/dL (ref 0.61–1.24)
Creatinine, Ser: 1.39 mg/dL — ABNORMAL HIGH (ref 0.61–1.24)
GFR, Estimated: >60 mL/min (ref >60)
GFR, Estimated: 59 mL/min — ABNORMAL LOW (ref >60)
Glucose, Bld: 138 mg/dL — ABNORMAL HIGH (ref 70–99)
Glucose, Bld: 145 mg/dL — ABNORMAL HIGH (ref 70–99)
Potassium: 4.4 mmol/L (ref 3.5–5.1)
Potassium: 4.2 mmol/L (ref 3.5–5.1)
Sodium: 137 mmol/L (ref 135–145)
Sodium: 133 mmol/L — ABNORMAL LOW (ref 135–145)

## 2020-11-10 LAB — GLUCOSE, CAPILLARY
Glucose-Capillary: 104 mg/dL — ABNORMAL HIGH (ref 70–99)
Glucose-Capillary: 108 mg/dL — ABNORMAL HIGH (ref 70–99)
Glucose-Capillary: 118 mg/dL — ABNORMAL HIGH (ref 70–99)
Glucose-Capillary: 125 mg/dL — ABNORMAL HIGH (ref 70–99)
Glucose-Capillary: 127 mg/dL — ABNORMAL HIGH (ref 70–99)
Glucose-Capillary: 131 mg/dL — ABNORMAL HIGH (ref 70–99)
Glucose-Capillary: 138 mg/dL — ABNORMAL HIGH (ref 70–99)
Glucose-Capillary: 143 mg/dL — ABNORMAL HIGH (ref 70–99)
Glucose-Capillary: 147 mg/dL — ABNORMAL HIGH (ref 70–99)
Glucose-Capillary: 171 mg/dL — ABNORMAL HIGH (ref 70–99)

## 2020-11-10 LAB — CBC
HCT: 28.1 % — ABNORMAL LOW (ref 39.0–52.0)
HCT: 29.4 % — ABNORMAL LOW (ref 39.0–52.0)
Hemoglobin: 9.4 g/dL — ABNORMAL LOW (ref 13.0–17.0)
Hemoglobin: 9.6 g/dL — ABNORMAL LOW (ref 13.0–17.0)
MCH: 30.2 pg (ref 26.0–34.0)
MCH: 29.6 pg (ref 26.0–34.0)
MCHC: 33.5 g/dL (ref 30.0–36.0)
MCHC: 32.7 g/dL (ref 30.0–36.0)
MCV: 90.4 fL (ref 80.0–100.0)
MCV: 90.7 fL (ref 80.0–100.0)
Platelets: 226 10*3/uL (ref 150–400)
Platelets: 229 10*3/uL (ref 150–400)
RBC: 3.11 MIL/uL — ABNORMAL LOW (ref 4.22–5.81)
RBC: 3.24 MIL/uL — ABNORMAL LOW (ref 4.22–5.81)
RDW: 12.6 % (ref 11.5–15.5)
RDW: 12.8 % (ref 11.5–15.5)
WBC: 16 10*3/uL — ABNORMAL HIGH (ref 4.0–10.5)
WBC: 21.1 10*3/uL — ABNORMAL HIGH (ref 4.0–10.5)
nRBC: 0 % (ref 0.0–0.2)
nRBC: 0 % (ref 0.0–0.2)

## 2020-11-10 LAB — MAGNESIUM
Magnesium: 2.2 mg/dL (ref 1.7–2.4)
Magnesium: 2.3 mg/dL (ref 1.7–2.4)

## 2020-11-10 MED ORDER — METOPROLOL TARTRATE 25 MG PO TABS
25.0000 mg | ORAL_TABLET | Freq: Two times a day (BID) | ORAL | Status: DC
  Administered 2020-11-10 – 2020-11-13 (×7): 25 mg via ORAL
  Filled 2020-11-10 (×7): qty 1

## 2020-11-10 MED ORDER — ENOXAPARIN SODIUM 40 MG/0.4ML ~~LOC~~ SOLN
40.0000 mg | Freq: Every day | SUBCUTANEOUS | Status: DC
  Administered 2020-11-10 – 2020-11-11 (×2): 40 mg via SUBCUTANEOUS
  Filled 2020-11-10 (×2): qty 0.4

## 2020-11-10 MED ORDER — INSULIN ASPART 100 UNIT/ML ~~LOC~~ SOLN
0.0000 [IU] | SUBCUTANEOUS | Status: DC
  Administered 2020-11-10: 2 [IU] via SUBCUTANEOUS

## 2020-11-10 MED ORDER — INSULIN ASPART 100 UNIT/ML ~~LOC~~ SOLN
0.0000 [IU] | Freq: Three times a day (TID) | SUBCUTANEOUS | Status: DC
  Administered 2020-11-11: 2 [IU] via SUBCUTANEOUS

## 2020-11-10 NOTE — Progress Notes (Signed)
      [address]       [place] [address]             [phone]                 1 Day Post-Op Procedure(s) (LRB): CORONARY ARTERY BYPASS GRAFTING (CABG) X THREE , USING LEFT INTERNAL MAMMARY ARTERY AND RIGHT LEG GREATER SAPHENOUS VEIN HARVESTED ENDOSCOPICALLY (N/A) TRANSESOPHAGEAL ECHOCARDIOGRAM (TEE) (N/A)   Events: No events Ambulated this am _______________________________________________________________ Vitals: BP (P) 116/62   Pulse (!) (P) 104   Temp 99.1 F (37.3 C)   Resp (!) 27   Ht 5\' 7"  (1.702 m)   Wt 110 kg   SpO2 91%   BMI 37.98 kg/m   - Neuro: alert NAD  - Cardiovascular: sinus tach  Drips: none.   PAP: (30-68)/(13-23) 30/15 CO:  [3.4 L/min-5.9 L/min] 5.9 L/min CI:  [1.6 L/min/m2-2.8 L/min/m2] 2.8 L/min/m2  - Pulm: EWOB.  SS CT output  ABG    Component Value Date/Time   PHART 7.314 (L) 11/09/2020 1910   PCO2ART 43.4 11/09/2020 1910   PO2ART 90 11/09/2020 1910   HCO3 21.8 11/09/2020 1910   TCO2 23 11/09/2020 1910   ACIDBASEDEF 4.0 (H) 11/09/2020 1910   O2SAT 96.0 11/09/2020 1910    - Abd: soft - Extremity: warm  .Intake/Output      03/25 0701 03/26 0700 03/26 0701 03/27 0700   P.O. 210    I.V. (mL/kg) 4247.9 (38.6) 19.7 (0.2)   Blood 250    IV Piggyback 1473.5    Total Intake(mL/kg) 6181.4 (56.2) 19.7 (0.2)   Urine (mL/kg/hr) 1940 (0.7) 60 (0.1)   Blood 341    Chest Tube 590 40   Total Output 2871 100   Net +3310.4 -80.3           _______________________________________________________________ Labs: CBC Latest Ref Rng & Units 11/10/2020 11/09/2020 11/09/2020  WBC 4.0 - 10.5 K/uL 16.0(H) - 18.3(H)  Hemoglobin 13.0 - 17.0 g/dL 11/11/2020) 10.2(L) 10.1(L)  Hematocrit 39.0 - 52.0 % 28.1(L) 30.0(L) 30.0(L)  Platelets 150 - 400 K/uL 226 - 250   CMP Latest Ref Rng & Units 11/10/2020 11/09/2020 11/09/2020  Glucose 70 - 99 mg/dL 11/11/2020) - 099(I)  BUN 6 - 20 mg/dL 16 - 15  Creatinine 338(S - 1.24 mg/dL 5.05 - 3.97  Sodium  6.73 - 145 mmol/L 137 140 139  Potassium 3.5 - 5.1 mmol/L 4.4 5.0 5.1  Chloride 98 - 111 mmol/L 106 - 109  CO2 22 - 32 mmol/L 22 - 23  Calcium 8.9 - 10.3 mg/dL 8.0(L) - 7.9(L)  Total Protein 6.5 - 8.1 g/dL - - -  Total Bilirubin 0.3 - 1.2 mg/dL - - -  Alkaline Phos 38 - 126 U/L - - -  AST 15 - 41 U/L - - -  ALT 0 - 44 U/L - - -    CXR: PV congestion and atelectasis  _______________________________________________________________  Assessment and Plan: POD 1 s/p CABG  Neuro: pain controlled.   CV: increasing BB.  On a/s.  Will remove a line, swan, and epicardial wires Pulm: continue pulm toilet Renal: good uop.  Creat stable GI: advancing diet Heme: stable ID: afebrile Endo: SSI Dispo: floor soon   419 11/10/2020 11:15 AM

## 2020-11-11 ENCOUNTER — Inpatient Hospital Stay (HOSPITAL_COMMUNITY): Payer: 59

## 2020-11-11 LAB — GLUCOSE, CAPILLARY
Glucose-Capillary: 134 mg/dL — ABNORMAL HIGH (ref 70–99)
Glucose-Capillary: 116 mg/dL — ABNORMAL HIGH (ref 70–99)
Glucose-Capillary: 104 mg/dL — ABNORMAL HIGH (ref 70–99)
Glucose-Capillary: 129 mg/dL — ABNORMAL HIGH (ref 70–99)

## 2020-11-11 LAB — CBC
HCT: 26 % — ABNORMAL LOW (ref 39.0–52.0)
Hemoglobin: 8.7 g/dL — ABNORMAL LOW (ref 13.0–17.0)
MCH: 30.7 pg (ref 26.0–34.0)
MCHC: 33.5 g/dL (ref 30.0–36.0)
MCV: 91.9 fL (ref 80.0–100.0)
Platelets: 209 10*3/uL (ref 150–400)
RBC: 2.83 MIL/uL — ABNORMAL LOW (ref 4.22–5.81)
RDW: 12.9 % (ref 11.5–15.5)
WBC: 19 10*3/uL — ABNORMAL HIGH (ref 4.0–10.5)
nRBC: 0 % (ref 0.0–0.2)

## 2020-11-11 LAB — BASIC METABOLIC PANEL
Anion gap: 5 (ref 5–15)
BUN: 21 mg/dL — ABNORMAL HIGH (ref 6–20)
CO2: 28 mmol/L (ref 22–32)
Calcium: 8.4 mg/dL — ABNORMAL LOW (ref 8.9–10.3)
Chloride: 100 mmol/L (ref 98–111)
Creatinine, Ser: 1.17 mg/dL (ref 0.61–1.24)
GFR, Estimated: >60 mL/min (ref >60)
Glucose, Bld: 121 mg/dL — ABNORMAL HIGH (ref 70–99)
Potassium: 4.5 mmol/L (ref 3.5–5.1)
Sodium: 133 mmol/L — ABNORMAL LOW (ref 135–145)

## 2020-11-11 MED ORDER — FUROSEMIDE 10 MG/ML IJ SOLN
40.0000 mg | Freq: Once | INTRAMUSCULAR | Status: AC
  Administered 2020-11-11: 40 mg via INTRAVENOUS
  Filled 2020-11-11 (×2): qty 4

## 2020-11-11 MED ORDER — POTASSIUM CHLORIDE CRYS ER 20 MEQ PO TBCR
40.0000 meq | EXTENDED_RELEASE_TABLET | Freq: Every day | ORAL | Status: DC
  Administered 2020-11-11 – 2020-11-14 (×4): 40 meq via ORAL
  Filled 2020-11-11 (×4): qty 2

## 2020-11-11 MED ORDER — SODIUM CHLORIDE 0.9% FLUSH
3.0000 mL | Freq: Two times a day (BID) | INTRAVENOUS | Status: DC
  Administered 2020-11-11 – 2020-11-14 (×7): 3 mL via INTRAVENOUS

## 2020-11-11 MED ORDER — ALBUTEROL SULFATE (2.5 MG/3ML) 0.083% IN NEBU: 2.5000 mg | INHALATION_SOLUTION | RESPIRATORY_TRACT | Status: DC | PRN

## 2020-11-11 MED ORDER — FUROSEMIDE 40 MG PO TABS
40.0000 mg | ORAL_TABLET | Freq: Every day | ORAL | Status: DC
  Administered 2020-11-12 – 2020-11-14 (×3): 40 mg via ORAL
  Filled 2020-11-11 (×3): qty 1

## 2020-11-11 MED ORDER — SODIUM CHLORIDE 0.9 % IV SOLN: 250.0000 mL | INTRAVENOUS | Status: DC | PRN

## 2020-11-11 MED ORDER — SODIUM CHLORIDE 0.9% FLUSH: 3.0000 mL | INTRAVENOUS | Status: DC | PRN

## 2020-11-11 MED ORDER — ~~LOC~~ CARDIAC SURGERY, PATIENT & FAMILY EDUCATION
Freq: Once | Status: AC
  Administered 2020-11-11: 1

## 2020-11-11 NOTE — Progress Notes (Signed)
Patient admitted to 4E. VS are stable. Pt is A/O x4. Will continue to monitor. Chest tubes placed to suction per order.  Brooke Pace, RN 11/11/20 1:48 PM

## 2020-11-11 NOTE — Progress Notes (Signed)
      [address]       [place] [address]             [phone]                 2 Days Post-Op Procedure(s) (LRB): CORONARY ARTERY BYPASS GRAFTING (CABG) X THREE , USING LEFT INTERNAL MAMMARY ARTERY AND RIGHT LEG GREATER SAPHENOUS VEIN HARVESTED ENDOSCOPICALLY (N/A) TRANSESOPHAGEAL ECHOCARDIOGRAM (TEE) (N/A)   Events: No events  _______________________________________________________________ Vitals: BP 109/77   Pulse 82   Temp 98.3 F (36.8 C) (Oral)   Resp (!) 27   Ht 5\' 7"  (1.702 m)   Wt 111.5 kg   SpO2 91%   BMI 38.50 kg/m   - Neuro: alert NAD  - Cardiovascular: sinus  Drips: none.   PAP: (26-34)/(15-20) 34/20  - Pulm: EWOB.  SS CT output  ABG    Component Value Date/Time   PHART 7.314 (L) 11/09/2020 1910   PCO2ART 43.4 11/09/2020 1910   PO2ART 90 11/09/2020 1910   HCO3 21.8 11/09/2020 1910   TCO2 23 11/09/2020 1910   ACIDBASEDEF 4.0 (H) 11/09/2020 1910   O2SAT 96.0 11/09/2020 1910    - Abd: soft - Extremity: warm  .Intake/Output      03/26 0701 03/27 0700 03/27 0701 03/28 0700   P.O. 1    I.V. (mL/kg) 90.4 (0.8)    Blood     IV Piggyback     Total Intake(mL/kg) 91.4 (0.8)    Urine (mL/kg/hr) 855 (0.3) 115 (0.4)   Blood     Chest Tube 480 140   Total Output 1335 255   Net -1243.6 -255           _______________________________________________________________ Labs: CBC Latest Ref Rng & Units 11/11/2020 11/10/2020 11/10/2020  WBC 4.0 - 10.5 K/uL 19.0(H) 21.1(H) 16.0(H)  Hemoglobin 13.0 - 17.0 g/dL 11/12/2020) 0.3(K) 7.4(Q)  Hematocrit 39.0 - 52.0 % 26.0(L) 29.4(L) 28.1(L)  Platelets 150 - 400 K/uL 209 229 226   CMP Latest Ref Rng & Units 11/11/2020 11/10/2020 11/10/2020  Glucose 70 - 99 mg/dL 11/12/2020) 638(V) 564(P)  BUN 6 - 20 mg/dL 329(J) 20 16  Creatinine 0.61 - 1.24 mg/dL 18(A 4.16) 6.06(T  Sodium 135 - 145 mmol/L 133(L) 133(L) 137  Potassium 3.5 - 5.1 mmol/L 4.5 4.2 4.4  Chloride 98 - 111 mmol/L 100 102 106  CO2 22 - 32  mmol/L 28 24 22   Calcium 8.9 - 10.3 mg/dL 0.16) ) 0.1(U)  Total Protein 6.5 - 8.1 g/dL - - -  Total Bilirubin 0.3 - 1.2 mg/dL - - -  Alkaline Phos 38 - 126 U/L - - -  AST 15 - 41 U/L - - -  ALT 0 - 44 U/L - - -    CXR: PV congestion and atelectasis  _______________________________________________________________  Assessment and Plan: POD 2 s/p CABG  Neuro: pain controlled.   CV: On a/s/BB.   Pulm: continue pulm toilet.  Tubes out tomorrow Renal: good uop.  Creat down.  Will diurese today GI: advancing diet Heme: stable ID: afebrile Endo: SSI Dispo: floor today   9.3(A 11/11/2020 9:42 AM

## 2020-11-12 ENCOUNTER — Other Ambulatory Visit: Payer: Self-pay

## 2020-11-12 ENCOUNTER — Inpatient Hospital Stay (HOSPITAL_COMMUNITY): Payer: 59

## 2020-11-12 ENCOUNTER — Encounter (HOSPITAL_COMMUNITY): Payer: Self-pay | Admitting: Thoracic Surgery (Cardiothoracic Vascular Surgery)

## 2020-11-12 LAB — BASIC METABOLIC PANEL
Anion gap: 5 (ref 5–15)
BUN: 24 mg/dL — ABNORMAL HIGH (ref 6–20)
CO2: 28 mmol/L (ref 22–32)
Calcium: 8.2 mg/dL — ABNORMAL LOW (ref 8.9–10.3)
Chloride: 101 mmol/L (ref 98–111)
Creatinine, Ser: 1.28 mg/dL — ABNORMAL HIGH (ref 0.61–1.24)
GFR, Estimated: >60 mL/min (ref >60)
Glucose, Bld: 115 mg/dL — ABNORMAL HIGH (ref 70–99)
Potassium: 4.3 mmol/L (ref 3.5–5.1)
Sodium: 134 mmol/L — ABNORMAL LOW (ref 135–145)

## 2020-11-12 LAB — CBC
HCT: 25.2 % — ABNORMAL LOW (ref 39.0–52.0)
Hemoglobin: 8.4 g/dL — ABNORMAL LOW (ref 13.0–17.0)
MCH: 30.3 pg (ref 26.0–34.0)
MCHC: 33.3 g/dL (ref 30.0–36.0)
MCV: 91 fL (ref 80.0–100.0)
Platelets: 223 10*3/uL (ref 150–400)
RBC: 2.77 MIL/uL — ABNORMAL LOW (ref 4.22–5.81)
RDW: 12.9 % (ref 11.5–15.5)
WBC: 16.7 10*3/uL — ABNORMAL HIGH (ref 4.0–10.5)
nRBC: 0 % (ref 0.0–0.2)

## 2020-11-12 MED ORDER — ASPIRIN EC 81 MG PO TBEC
81.0000 mg | DELAYED_RELEASE_TABLET | Freq: Every day | ORAL | Status: DC
  Administered 2020-11-12 – 2020-11-14 (×3): 81 mg via ORAL
  Filled 2020-11-12 (×3): qty 1

## 2020-11-12 MED ORDER — CLOPIDOGREL BISULFATE 75 MG PO TABS
75.0000 mg | ORAL_TABLET | Freq: Every day | ORAL | Status: DC
  Administered 2020-11-12 – 2020-11-14 (×3): 75 mg via ORAL
  Filled 2020-11-12 (×3): qty 1

## 2020-11-12 MED ORDER — FE FUMARATE-B12-VIT C-FA-IFC PO CAPS
1.0000 | ORAL_CAPSULE | Freq: Every day | ORAL | Status: DC
  Administered 2020-11-12 – 2020-11-14 (×3): 1 via ORAL
  Filled 2020-11-12 (×3): qty 1

## 2020-11-12 MED FILL — Magnesium Sulfate Inj 50%: INTRAMUSCULAR | Qty: 10 | Status: AC

## 2020-11-12 MED FILL — Potassium Chloride Inj 2 mEq/ML: INTRAVENOUS | Qty: 40 | Status: AC

## 2020-11-12 MED FILL — Heparin Sodium (Porcine) Inj 1000 Unit/ML: INTRAMUSCULAR | Qty: 30 | Status: AC

## 2020-11-12 NOTE — Progress Notes (Signed)
CARDIAC REHAB PHASE I   Offered to walk with pt. Pt states recent ambulation with mobility team. Maintaining sats off O2. Feels better now that the chest tubes are out. IS missing from bedside, pt given a new one. Stressed importance of continued ambulation, IS use, and sitting in chair. Pt agreeable. Will continue to follow.  [id number] Reynold Bowen, RN BSN 11/12/2020 2:17 PM

## 2020-11-12 NOTE — Addendum Note (Signed)
Addendum  created 11/12/20 0936 by Rachel Moulds, CRNA   Order list changed, Pharmacy for encounter modified

## 2020-11-12 NOTE — Addendum Note (Signed)
Addendum  created 11/12/20 0736 by Rachel Moulds, CRNA   Order list changed

## 2020-11-12 NOTE — Progress Notes (Signed)
Mobility Specialist - Progress Note   11/12/20 1340  Mobility  Activity Ambulated in hall  Level of Assistance Minimal assist, patient does 75% or more  Assistive Device Front wheel walker  Distance Ambulated (ft) 470 ft  Mobility Response Tolerated well  Mobility performed by Mobility specialist  $Mobility charge 1 Mobility   Pre-mobility: 104 HR, 106/67 BP Post-mobility: 108 HR, 131/87 BP  Pt min assist to elevate trunk when sitting up on edge of bed. Pt asx throughout, back in bed after walk.   Mamie Levers Mobility Specialist Mobility Specialist Phone: [phone]

## 2020-11-12 NOTE — Progress Notes (Signed)
All chest tubes removed without difficulty per MD order.  Pt educated on bed rest for an hour and BP's being cycled.  Will continue to monitor.

## 2020-11-12 NOTE — Progress Notes (Addendum)
[address]       [place] [address]             [phone]      3 Days Post-Op Procedure(s) (LRB): CORONARY ARTERY BYPASS GRAFTING (CABG) X THREE , USING LEFT INTERNAL MAMMARY ARTERY AND RIGHT LEG GREATER SAPHENOUS VEIN HARVESTED ENDOSCOPICALLY (N/A) TRANSESOPHAGEAL ECHOCARDIOGRAM (TEE) (N/A) Subjective:  Awake and alert, transferred to 4 E. yesterday.  Says he rested well.  No new concerns.  He is progressing with ambulation.  Objective: Vital signs in last 24 hours: Temp:  [97.9 F (36.6 C)-99.1 F (37.3 C)] 97.9 F (36.6 C) (03/28 0350) Pulse Rate:  [84-108] 92 (03/28 0350) Cardiac Rhythm: Sinus tachycardia (03/27 1900) Resp:  [15-20] 20 (03/28 0350) BP: (91-115)/(67-85) 115/79 (03/28 0350) SpO2:  [90 %-99 %] 96 % (03/28 0350) Weight:  [110.5 kg] 110.5 kg (03/28 0350)  Hemodynamic parameters for last 24 hours:    Intake/Output from previous day: 03/27 0701 - 03/28 0700 In: 0  Out: 1770 [Urine:1530; Chest Tube:240] Intake/Output this shift: No intake/output data recorded.  General appearance: alert, cooperative and no distress Neurologic: intact Heart: regular rate and rhythm Lungs: clear to auscultation bilaterally Abdomen: Soft and nontender Extremities: Warm and well-perfused.  Right lower extremity EVH incision is intact and dry.  No obvious hematoma over the tunnel. Wound: The sternotomy incision is covered with an Aquacel dressing.  (Will DC today)  Lab Results: Recent Labs    11/11/20 0416 11/12/20 0115  WBC 19.0* 16.7*  HGB 8.7* 8.4*  HCT 26.0* 25.2*  PLT 209 223   BMET:  Recent Labs    11/11/20 0416 11/12/20 0115  NA 133* 134*  K 4.5 4.3  CL 100 101  CO2 28 28  GLUCOSE 121* 115*  BUN 21* 24*  CREATININE 1.17 1.28*  CALCIUM 8.4* 8.2*    PT/INR:  Recent Labs    11/09/20 1309  LABPROT 15.9*  INR 1.3*   ABG    Component Value Date/Time   PHART 7.314 (L) 11/09/2020 1910   HCO3 21.8 11/09/2020 1910   TCO2  23 11/09/2020 1910   ACIDBASEDEF 4.0 (H) 11/09/2020 1910   O2SAT 96.0 11/09/2020 1910   CBG (last 3)  Recent Labs    11/11/20 1140 11/11/20 1716 11/11/20 2128  GLUCAP 116* 104* 129*    Assessment/Plan: S/P Procedure(s) (LRB): CORONARY ARTERY BYPASS GRAFTING (CABG) X THREE , USING LEFT INTERNAL MAMMARY ARTERY AND RIGHT LEG GREATER SAPHENOUS VEIN HARVESTED ENDOSCOPICALLY (N/A) TRANSESOPHAGEAL ECHOCARDIOGRAM (TEE) (N/A)  -Postop day 3 CABG x3 for multivessel coronary artery disease presenting with acute ST elevation myocardial infarction that was aborted with successful PTCI of the RCA. Preop EF~45%.  Progressing well, stable cardiac rhythm and vital signs.  He is also doing well with mobility.  Continue aspirin, metoprolol, atorvastatin.  Add clopidogrel for acute MI preop.  DC the chest tubes and pacer wires today.  -Expected acute blood loss anemia: Hematocrit 25 and reasonably stable.  He is tolerating this well.  Trinsicon added  -History of hypertension: He was not taking any medications prior to surgery.  Control has been reasonable.  Continue metoprolol 25 mg p.o. twice daily.  -Postoperative volume excess.  Weight is still about 4 kg above preop.  He does not appear edematous on exam.  Continue diuresis with oral Lasix, monitor creatinine.  -DVT prophylaxis-well with mobility, continue daily enoxaparin subcu.  -Disposition: Anticipate discharge to home in 1 to 2 days.  LOS: 5 days    Leary Roca, PA-C [phone] 11/12/2020    I have seen and examined the patient and agree with the assessment and plan as outlined.  D/C chest tubes.  Mobilize  Purcell Nails, MD 11/12/2020

## 2020-11-13 ENCOUNTER — Encounter (HOSPITAL_COMMUNITY): Payer: Self-pay | Admitting: Thoracic Surgery (Cardiothoracic Vascular Surgery)

## 2020-11-13 ENCOUNTER — Inpatient Hospital Stay (HOSPITAL_COMMUNITY): Payer: 59

## 2020-11-13 LAB — CBC
HCT: 24.4 % — ABNORMAL LOW (ref 39.0–52.0)
Hemoglobin: 8.3 g/dL — ABNORMAL LOW (ref 13.0–17.0)
MCH: 30.6 pg (ref 26.0–34.0)
MCHC: 34 g/dL (ref 30.0–36.0)
MCV: 90 fL (ref 80.0–100.0)
Platelets: 329 10*3/uL (ref 150–400)
RBC: 2.71 MIL/uL — ABNORMAL LOW (ref 4.22–5.81)
RDW: 13.2 % (ref 11.5–15.5)
WBC: 14 10*3/uL — ABNORMAL HIGH (ref 4.0–10.5)
nRBC: 0 % (ref 0.0–0.2)

## 2020-11-13 LAB — BASIC METABOLIC PANEL
Anion gap: 6 (ref 5–15)
BUN: 21 mg/dL — ABNORMAL HIGH (ref 6–20)
CO2: 27 mmol/L (ref 22–32)
Calcium: 8.2 mg/dL — ABNORMAL LOW (ref 8.9–10.3)
Chloride: 103 mmol/L (ref 98–111)
Creatinine, Ser: 1.03 mg/dL (ref 0.61–1.24)
GFR, Estimated: >60 mL/min (ref >60)
Glucose, Bld: 113 mg/dL — ABNORMAL HIGH (ref 70–99)
Potassium: 3.8 mmol/L (ref 3.5–5.1)
Sodium: 136 mmol/L (ref 135–145)

## 2020-11-13 MED ORDER — POLYETHYLENE GLYCOL 3350 17 G PO PACK: 17.0000 g | PACK | Freq: Every day | ORAL | Status: DC | PRN

## 2020-11-13 MED ORDER — GUAIFENESIN ER 600 MG PO TB12
1200.0000 mg | ORAL_TABLET | Freq: Two times a day (BID) | ORAL | Status: DC
  Administered 2020-11-13 – 2020-11-14 (×3): 1200 mg via ORAL
  Filled 2020-11-13 (×3): qty 2

## 2020-11-13 NOTE — Discharge Summary (Signed)
Physician Discharge Summary  Patient ID: Jorge Molina. MRN: [id number] DOB/AGE: 58-Jun-1962 58 y.o.  Admit date: 11/07/2020 Discharge date: 11/14/2020  Admission Diagnoses:  STEMI involving right coronary artery  Obesity (BMI 35.0-39.9 without comorbidity) Essential hypertension Dyslipidemia History of benign prostatic hyperplasia   Discharge Diagnoses:    Multivessel coronary artery disease S/P CABG x 3 Expected acute blood loss anemia Obesity (BMI 35.0-39.9 without comorbidity) Essential hypertension Dyslipidemia STEMI involving right coronary artery (HCC) History of benign prostatic hyperplasia   Discharged Condition: stable  History of Present Illness:  Patient is a 60 year old obese male with no previous history of coronary artery disease but multiple risk factors notable for history of hypertension, hyperlipidemia, remote tobacco use, and a strong family history of coronary artery disease.  He describes a 73-month history of classical symptoms of exertional angina culminating in his acute presentation early this morning with acute inferior wall ST segment elevation myocardial infarction.  He was taken directly to the cardiac Cath Lab by Dr. Excell Seltzer where he underwent primary balloon angioplasty of the culprit right coronary artery lesion.  Additional findings performed at the time of catheterization revealed severe left main coronary artery stenosis with subtotal occlusion of the mid left anterior descending coronary artery but left the left collateral filling of the distal left anterior descending coronary artery.  Following primary balloon angioplasty of the right coronary artery the patient immediately stabilized with resolution of his symptoms of chest pain.  Left ventricular end-diastolic pressure was normal.  There was moderate segmental left ventricular systolic dysfunction with ejection fraction estimated 45% including significant anterolateral hypokinesis suggestive of  remote history of previous anterolateral wall myocardial infarction in the distant past.  At present the patient remains pain-free and without any shortness of breath.  Of note, the patient received a loading dose of 600 mg clopidogrel at the time of his presentation to [facility] in [place].   I have personally reviewed the patient's diagnostic cardiac catheterization and discussed findings with Dr. Excell Seltzer.  We both agree that patient would best be treated with surgical revascularization.  Presuming that the patient continues to remain stable we will tentatively plan to proceed with coronary artery bypass grafting on Friday, November 09, 2020. I have reviewed the indications, risks, and potential benefits of coronary artery bypass grafting with the patient and his sister at the bedside.  Alternative treatment strategies have been discussed, including the relative risks, benefits and long term prognosis associated with medical therapy, percutaneous coronary intervention, and surgical revascularization.  The patient understands and accepts all potential associated risks of surgery including but not limited to risk of death, stroke or other neurologic complication, myocardial infarction, congestive heart failure, respiratory failure, renal failure, bleeding requiring blood transfusion and/or reexploration, aortic dissection or other major vascular complication, arrhythmia, heart block or bradycardia requiring permanent pacemaker, pneumonia, pleural effusion, wound infection, pulmonary embolus or other thromboembolic complication, chronic pain or other delayed complications related to median sternotomy, or the late recurrence of symptomatic ischemic heart disease and/or congestive heart failure.  The importance of long term risk modification have been emphasized.  All questions answered.  Hospital Course:  Mr. Earwood remained stable following left heart catheterization.  He had no further chest pain.  The  echo confirmed ejection fraction of 45 to 50%.  There were no significant structural or functional valvular abnormalities.  He was taken to the operating room on 11/09/2020 where three-vessel coronary bypass grafting was accomplished without complication.  Left internal mammary artery was grafted  to the left anterior descending coronary artery and saphenous veins were grafted to the posterior descending and obtuse marginal coronary arteries.  Following the procedure, he separated from cardiopulmonary bypass without difficulty.  He was transferred to the surgical ICU in stable condition.  The patient was extubated the evening of surgery without difficulty. His arterial lines, swan ganz catheter and pacing wires were removed without difficulty on POD #1.  He was maintaining NSR and was stable for transfer to the progressive care unit on 11/11/2020.  Progress continued.  He quickly regained independence with his mobility.  Diet was advanced and well-tolerated and return of appropriate bowel bladder function. Blood pressure and HR trended up as he recovered so the metoprolol was gradually titrated from 12.5mg  BID to 50mg  po BID.  Incisions were healing with no evidence of complication at the time of discharge.  Consults: cardiology  Significant Diagnostic Studies:  Coronary/Graft Acute MI Revascularization  LEFT HEART CATH AND CORONARY ANGIOGRAPHY    Conclusion    Prox RCA to Mid RCA lesion is 40% stenosed.  Dist RCA lesion is 95% stenosed.  Mid LM to Dist LM lesion is 70% stenosed.  Mid LAD lesion is 100% stenosed.  1st Mrg lesion is 65% stenosed.  There is mild to moderate left ventricular systolic dysfunction.  LV end diastolic pressure is normal.  The left ventricular ejection fraction is 45-50% by visual estimate.  Balloon angioplasty was performed using a BALLOON SAPPHIRE 3.0X12.  Post intervention, there is a 35% residual stenosis.   1.  Severe mid to distal left main stenosis 2.   Chronic total occlusion of the LAD just after the first diagonal branch with the mid and distal vessel filling late via left to left collaterals 3.  Moderate mid circumflex stenosis into a large caliber first obtuse marginal vessel 4.  Critical stenosis of the distal RCA (culprit lesion), treated successfully with balloon angioplasty using a 3.0 x 12 mm balloon 5.  Mild to moderate segmental left ventricular systolic dysfunction with anterolateral hypokinesis and basal inferior hypokinesis of the LV, estimated LVEF 45%  Recommendations: Because of the patient's multivessel disease with left main stenosis, I reviewed the patient's cardiac catheterization films with Dr. .  We discussed treatment options and agreed that primary PCI to stabilize the patient's distal RCA is appropriate after this patient was loaded with 600 mg clopidogrel this morning.  He has surgical coronary anatomy, but will have reduced bleeding risk once Plavix washout occurs.  Other lesions appear stable/chronic based on angiographic features.  Will initiate IV tirofiban and patient will have cardiac surgical consultation for consideration of multivessel CABG during his index hospitalization.  Wall Motion  Resting                 Left Heart  Left Ventricle The left ventricular size is normal. There is mild to moderate left ventricular systolic dysfunction. LV end diastolic pressure is normal. The left ventricular ejection fraction is 45-50% by visual estimate. There are LV function abnormalities due to segmental dysfunction.   Coronary Diagrams   Diagnostic Dominance: Right    Intervention       ECHOCARDIOGRAM REPORT       Patient Name:  Jorge Molina. Date of Exam: 11/07/2020  Medical Rec #: 11/09/2020     Height:    67.0 in  Accession #:  [id number]    Weight:    235.0 lb  Date of Birth: 07-28-1961     BSA:  2.166 m  Patient Age:  60 years     BP:       148/102 mmHg  Patient Gender: M         HR:      77 bpm.  Exam Location: Inpatient   Procedure: 2D Echo, Cardiac Doppler, Color Doppler and Intracardiac       Opacification Agent   Indications:  121-121.4 ST elevation (STEMI) and non-ST elevation  (NSTEMI)         myocardial infarction    History:    Patient has no prior history of Echocardiogram  examinations. CAD         and Acute MI, Signs/Symptoms:Chest Pain; Risk         Factors:Hypertension and Dyslipidemia.    Sonographer:  Sheralyn Boatman RDCS  Referring Phys: [phone] MICHAEL COOPER     Sonographer Comments: Technically difficult study due to poor echo  windows. Image acquisition challenging due to patient body habitus.  Supine, could not turn. Post cath procedure.  IMPRESSIONS    1. Left ventricular ejection fraction, by estimation, is 45 to 50%. The  left ventricle has mildly decreased function. The left ventricle  demonstrates regional wall motion abnormalities (see scoring  diagram/findings for description). There is mild left  ventricular hypertrophy. Left ventricular diastolic parameters were  normal.  2. Right ventricular systolic function is normal. The right ventricular  size is normal. Tricuspid regurgitation signal is inadequate for assessing  PA pressure.  3. The mitral valve is normal in structure. No evidence of mitral valve  regurgitation.  4. The aortic valve is tricuspid. Aortic valve regurgitation is trivial.  No aortic stenosis is present.  5. Aortic dilatation noted. There is mild dilatation of the ascending  aorta, measuring 40 mm.  6. The inferior vena cava is dilated in size with >50% respiratory  variability, suggesting right atrial pressure of 8 mmHg.   FINDINGS  Left Ventricle: Left ventricular ejection fraction, by estimation, is 45  to 50%. The left ventricle has mildly decreased function. The left  ventricle demonstrates  regional wall motion abnormalities. Definity  contrast agent was given IV to delineate the  left ventricular endocardial borders. The left ventricular internal cavity  size was normal in size. There is mild left ventricular hypertrophy. Left  ventricular diastolic parameters were normal.     LV Wall Scoring:  The apical septal segment, apical anterior segment, apical inferior  segment,  and apex are hypokinetic. The anterior wall, entire lateral wall, anterior  septum, inferior wall, mid inferoseptal segment, and basal inferoseptal  segment are normal.   Right Ventricle: The right ventricular size is normal. No increase in  right ventricular wall thickness. Right ventricular systolic function is  normal. Tricuspid regurgitation signal is inadequate for assessing PA  pressure.   Left Atrium: Left atrial size was normal in size.   Right Atrium: Right atrial size was normal in size.   Pericardium: Trivial pericardial effusion is present.   Mitral Valve: The mitral valve is normal in structure. No evidence of  mitral valve regurgitation.   Tricuspid Valve: The tricuspid valve is normal in structure. Tricuspid  valve regurgitation is not demonstrated.   Aortic Valve: The aortic valve is tricuspid. Aortic valve regurgitation is  trivial. No aortic stenosis is present.   Pulmonic Valve: The pulmonic valve was not well visualized. Pulmonic valve  regurgitation is not visualized.   Aorta: The aortic root is normal in size and structure and aortic  dilatation noted. There  is mild dilatation of the ascending aorta,  measuring 40 mm.   Venous: The inferior vena cava is dilated in size with greater than 50%  respiratory variability, suggesting right atrial pressure of 8 mmHg.   IAS/Shunts: The interatrial septum was not well visualized.     LEFT VENTRICLE  PLAX 2D  LVIDd:     4.70 cm   Diastology  LVIDs:     3.60 cm   LV e' medial:  7.07 cm/s  LV PW:      1.40 cm   LV E/e' medial: 8.5  LV IVS:    1.30 cm   LV e' lateral:  9.25 cm/s  LVOT diam:   2.20 cm   LV E/e' lateral: 6.5  LV SV:     68  LV SV Index:  32  LVOT Area:   3.80 cm    LV Volumes (MOD)  LV vol d, MOD A2C: 110.0 ml  LV vol d, MOD A4C: 131.0 ml  LV vol s, MOD A2C: 59.7 ml  LV vol s, MOD A4C: 72.5 ml  LV SV MOD A2C:   50.3 ml  LV SV MOD A4C:   131.0 ml  LV SV MOD BP:   51.4 ml   RIGHT VENTRICLE       IVC  RV S prime:   14.10 cm/s IVC diam: 2.10 cm  TAPSE (M-mode): 1.9 cm   LEFT ATRIUM       Index    RIGHT ATRIUM     Index  LA diam:    3.20 cm 1.48 cm/m RA Area:   9.65 cm  LA Vol (A2C):  26.1 ml 12.05 ml/m RA Volume:  16.70 ml 7.71 ml/m  LA Vol (A4C):  31.5 ml 14.54 ml/m  LA Biplane Vol: 31.1 ml 14.36 ml/m  AORTIC VALVE  LVOT Vmax:  95.30 cm/s  LVOT Vmean: 62.600 cm/s  LVOT VTI:  0.180 m    AORTA  Ao Root diam: 3.40 cm  Ao Asc diam: 4.00 cm   MITRAL VALVE  MV Area (PHT): 3.74 cm  SHUNTS  MV Decel Time: 203 msec  Systemic VTI: 0.18 m  MV E velocity: 60.20 cm/s Systemic Diam: 2.20 cm  MV A velocity: 65.15 cm/s  MV E/A ratio: 0.92   Epifanio Lesches MD  Electronically signed by Epifanio Lesches MD  Signature Date/Time: 11/07/2020/2:36:24 PM     Treatments:   CARDIOTHORACIC SURGERY OPERATIVE NOTE  Date of Procedure:    11/09/2020  Preoperative Diagnosis:        Severe 3-vessel Coronary Artery Disease  S/P Acute ST-segment Elevation Myocardial Infarction  Postoperative Diagnosis:    Same  Procedure:        Coronary Artery Bypass Grafting x 3              Left Internal Mammary Artery to Distal Left Anterior Descending Coronary Artery             Saphenous Vein Graft to Posterior Descending Coronary Artery             Saphenous Vein Graft to Obtuse Marginal Branch of Left Circumflex Coronary Artery             Endoscopic Vein Harvest from Right Thigh    Surgeon:        Salvatore Decent. Cornelius Moras, MD  Assistant:       Jillyn Hidden, PA-C  Anesthesia:    Germaine Pomfret, MD  Operative Findings: ? Mild left ventricular systolic  dysfunction with inferior wall hypokinesis ? Good quality left internal mammary artery conduit ? Good quality saphenous vein conduit ? Good quality target vessels for grafting    BRIEF CLINICAL NOTE AND INDICATIONS FOR SURGERY  Patient is a 60 year old obese male with no previous history of coronary artery disease but multiple risk factors notable for history of hypertension, hyperlipidemia, remote tobacco use, and a strong family history of coronary artery disease. He describes a 50-month history of classical symptoms of exertional angina culminating in his acute presentation early this morning with acute inferior wall ST segment elevation myocardial infarction. He was taken directly to the cardiac Cath Lab by Dr. Excell Seltzer where he underwent primary balloon angioplasty of the culprit right coronary artery lesion. Additional findings performed at the time of catheterization revealed severe left main coronary artery stenosis with subtotal occlusion of the mid left anterior descending coronary artery but left the left collateral filling of the distal left anterior descending coronary artery. Following primary balloon angioplasty of the right coronary artery the patient immediately stabilized with resolution of his symptoms of chest pain. Left ventricular end-diastolic pressure was normal. There was moderate segmental left ventricular systolic dysfunction with ejection fraction estimated 45% including significant anterolateral hypokinesis suggestive of remote history of previous anterolateral wall myocardial infarction in the distant past. The patient has been seen in consultation and counseled at length regarding the indications, risks and potential benefits of surgery.  All questions have been answered, and the  patient provides full informed consent for the operation as described.     Discharge Exam: Blood pressure 138/89, pulse 100, temperature 98 F (36.7 C), temperature source Oral, resp. rate 17, height  (1.702 m), weight 106.7 kg, SpO2 96 %.  General appearance: alert, cooperative and no distress Neurologic: intact Heart: SR ~100/min  Lungs: breath sounds are clear Abdomen: Soft and nontender Extremities: Warm and well-perfused.  Right lower extremity EVH incision is intact and dry.   Wound: The sternotomy incision is intact and dry.  Disposition:   Discharge Instructions    AMB Referral to Cardiac Rehabilitation - Phase II   Complete by: As directed    Diagnosis: CABG   CABG X ___: 3   After initial evaluation and assessments completed: Virtual Based Care may be provided alone or in conjunction with Phase 2 Cardiac Rehab based on patient barriers.: Yes     Allergies as of 11/14/2020   No Known Allergies     Medication List    TAKE these medications   aspirin EC 81 MG tablet Take 81 mg by mouth daily. Swallow whole.   atorvastatin 80 MG tablet Commonly known as: LIPITOR Take 1 tablet (80 mg total) by mouth daily. What changed:   medication strength  how much to take  additional instructions   clopidogrel 75 MG tablet Commonly known as: PLAVIX Take 1 tablet (75 mg total) by mouth daily.   furosemide 40 MG tablet Commonly known as: LASIX Take 1 tablet (40 mg total) by mouth daily for 5 days.   lisinopril 5 MG tablet Commonly known as: ZESTRIL Take 1 tablet (5 mg total) by mouth daily. NO MORE REFILLS WITHOUT OFFICE VISIT - 2ND NOTICE   Melatonin 10 MG Tabs Take 10 mg by mouth at bedtime as needed (sleep).   metoprolol tartrate 50 MG tablet Commonly known as: LOPRESSOR Take 1 tablet (50 mg total) by mouth 2 (two) times daily.   potassium chloride SA 20 MEQ tablet Commonly known as: KLOR-CON Take 1  tablet (20 mEq total) by mouth daily for 5 days.    traMADol 50 MG tablet Commonly known as: ULTRAM Take 1-2 tablets (50-100 mg total) by mouth every 4 (four) hours as needed for up to 5 days for moderate pain.   VITAMIN D PO Take 1 capsule by mouth daily.       Follow-up Information    Revankar, Aundra Dubinajan R, MD. Go on 11/26/2020.   Specialty: Cardiology Why: Your appointment is at 9:20am. Contact information: [address] [place] [id number] [phone]        [facility] [place]. Go on 11/19/2020.   Specialty: Cardiothoracic Surgery Why: Your appointment for suture removal is at 11 AM. Contact information: [address], [address] [place] [id number] [phone]       [facility] [place]. Go on 12/10/2020.   Specialty: Cardiothoracic Surgery Why: Your follow-up appointment is at 1 PM.  Please arrive 30 minutes early for chest x-ray to be performed by [facility] located on the first floor of the same building. Contact information: [address], [address] [place] [id number] [phone]            The patient has been discharged on:   1.Beta Blocker:  Yes [ x  ]                              No   [   ]                              If No, reason:  2.Ace Inhibitor/ARB: Yes [x   ]                                     No  [    ]                                     If No, reason:  3.Statin:   Yes [ x  ]                  No  [   ]                  If No, reason:  4.Ecasa:  Yes  [ x  ]                  No   [   ]                  If No, reason:     Signed: Leary RocaMyron G. Aristotelis Vilardi, PA-C 11/14/2020, 8:32 AM

## 2020-11-13 NOTE — Progress Notes (Signed)
Mobility Specialist - Progress Note   11/13/20 1719  Mobility  Activity Ambulated in hall  Level of Assistance Independent  Assistive Device None  Distance Ambulated (ft) 560 ft  Mobility Response Tolerated well  Mobility performed by Mobility specialist  $Mobility charge 1 Mobility   Pre-mobility: 111 HR During mobility: 123 HR Post-mobility: 112 HR  Pt asx throughout ambulation. Pt to bathroom after walk, he has been moving independently in his room.   Mamie Levers Mobility Specialist Mobility Specialist Phone: [phone]

## 2020-11-13 NOTE — Progress Notes (Signed)
CARDIAC REHAB PHASE I   PRE:  Rate/Rhythm: 90 SR    BP: sitting 112/74    SaO2: 93 RA  MODE:  Ambulation: 470 ft   POST:  Rate/Rhythm: 109 ST    BP: sitting 125/95     SaO2: 94 RA  Pt able to ambulate without AD or assist. Moving well, quick pace. Return to recliner. Gave diet sheet and d/c video to view. Encouraged IS and x2 more walks. He can walk independently. [id number]   Harriet Masson CES, ACSM 11/13/2020 10:31 AM

## 2020-11-13 NOTE — Progress Notes (Addendum)
[address]       [place] [address]             [phone]      4 Days Post-Op Procedure(s) (LRB): CORONARY ARTERY BYPASS GRAFTING (CABG) X THREE , USING LEFT INTERNAL MAMMARY ARTERY AND RIGHT LEG GREATER SAPHENOUS VEIN HARVESTED ENDOSCOPICALLY (N/A) TRANSESOPHAGEAL ECHOCARDIOGRAM (TEE) (N/A) Subjective:  Awake and alert. Say he has had a lot of cough with thick secretions he is having trouble clearing.  Doing well with ambulation and pain is controlled.  Passing gas but no BM yet.  Objective: Vital signs in last 24 hours: Temp:  [97.9 F (36.6 C)-98.9 F (37.2 C)] 98.1 F (36.7 C) (03/29 0423) Pulse Rate:  [98-108] 100 (03/29 0423) Cardiac Rhythm: Normal sinus rhythm (03/29 0350) Resp:  [18-20] 20 (03/29 0423) BP: (115-133)/(77-81) 128/81 (03/29 0423) SpO2:  [93 %-96 %] 96 % (03/29 0423) Weight:  [108.1 kg] 108.1 kg (03/29 0423)     Intake/Output from previous day: 03/28 0701 - 03/29 0700 In: -  Out: 850 [Urine:850] Intake/Output this shift: No intake/output data recorded.  General appearance: alert, cooperative and no distress Neurologic: intact Heart: regular rate and rhythm Lungs: breath sounds are clear Abdomen: Soft and nontender Extremities: Warm and well-perfused.  Right lower extremity EVH incision is intact and dry.   Wound: The sternotomy incision is intact and dry.  Lab Results: Recent Labs    11/12/20 0115 11/13/20 0140  WBC 16.7* 14.0*  HGB 8.4* 8.3*  HCT 25.2* 24.4*  PLT 223 329   BMET:  Recent Labs    11/12/20 0115 11/13/20 0140  NA 134* 136  K 4.3 3.8  CL 101 103  CO2 28 27  GLUCOSE 115* 113*  BUN 24* 21*  CREATININE 1.28* 1.03  CALCIUM 8.2* 8.2*    PT/INR:  No results for input(s): LABPROT, INR in the last 72 hours. ABG    Component Value Date/Time   PHART 7.314 (L) 11/09/2020 1910   HCO3 21.8 11/09/2020 1910   TCO2 23 11/09/2020 1910   ACIDBASEDEF 4.0 (H) 11/09/2020 1910   O2SAT 96.0  11/09/2020 1910   CBG (last 3)  Recent Labs    11/11/20 1140 11/11/20 1716 11/11/20 2128  GLUCAP 116* 104* 129*    Assessment/Plan: S/P Procedure(s) (LRB): CORONARY ARTERY BYPASS GRAFTING (CABG) X THREE , USING LEFT INTERNAL MAMMARY ARTERY AND RIGHT LEG GREATER SAPHENOUS VEIN HARVESTED ENDOSCOPICALLY (N/A) TRANSESOPHAGEAL ECHOCARDIOGRAM (TEE) (N/A)  -Postop day 4 CABG x3 for multivessel coronary artery disease presenting with acute ST elevation myocardial infarction that was aborted with successful PTCI of the RCA. Preop EF~45%.  Progressing well, stable cardiac rhythm and vital signs.  He is also doing well with mobility.  Continue aspirin, metoprolol, atorvastatin.  Added clopidogrel for acute MI preop.    -Expected acute blood loss anemia: Hematocrit reasonably stable.  He is tolerating this well.  Trinsicon added  -History of hypertension: He was not taking any medications prior to surgery.  Control has been reasonable.  Continue metoprolol 25 mg p.o. twice daily.  -Postoperative volume excess.  Weight down 2kg from yesterday but  is still about 2 kg above preop.  He does not appear edematous on exam.  Continue diuresis with oral Lasix,  Creatinine has normalized.  -DVT prophylaxis-well with mobility, continue daily enoxaparin subcu.  -Cough, thick secretions- CXR stable, LLL ATX. WBC is normalizing. Add po Mucinex BID.  -No BM- Miralax this AM.   -Disposition:  Anticipate discharge to home tomorrow.   LOS: 6 days    Leary Roca, PA-C [phone] 11/13/2020   I have seen and examined the patient and agree with the assessment and plan as outlined.  Purcell Nails, MD 11/13/2020 11:58 AM

## 2020-11-14 MED ORDER — FUROSEMIDE 40 MG PO TABS: 40.0000 mg | ORAL_TABLET | Freq: Every day | ORAL | 0 refills | Status: DC

## 2020-11-14 MED ORDER — ATORVASTATIN CALCIUM 80 MG PO TABS: 80.0000 mg | ORAL_TABLET | Freq: Every day | ORAL | 2 refills | Status: DC

## 2020-11-14 MED ORDER — METOPROLOL TARTRATE 50 MG PO TABS
50.0000 mg | ORAL_TABLET | Freq: Two times a day (BID) | ORAL | Status: DC
  Administered 2020-11-14: 50 mg via ORAL
  Filled 2020-11-14: qty 1

## 2020-11-14 MED ORDER — CLOPIDOGREL BISULFATE 75 MG PO TABS: 75.0000 mg | ORAL_TABLET | Freq: Every day | ORAL | 5 refills | Status: DC

## 2020-11-14 MED ORDER — METOPROLOL TARTRATE 50 MG PO TABS: 50.0000 mg | ORAL_TABLET | Freq: Two times a day (BID) | ORAL | 2 refills | Status: DC

## 2020-11-14 MED ORDER — TRAMADOL HCL 50 MG PO TABS: 50.0000 mg | ORAL_TABLET | ORAL | 0 refills | Status: AC | PRN

## 2020-11-14 MED ORDER — POTASSIUM CHLORIDE CRYS ER 20 MEQ PO TBCR: 20.0000 meq | EXTENDED_RELEASE_TABLET | Freq: Every day | ORAL | 0 refills | Status: DC

## 2020-11-14 NOTE — Progress Notes (Signed)
[address]       [place] [address]             [phone]      5 Days Post-Op Procedure(s) (LRB): CORONARY ARTERY BYPASS GRAFTING (CABG) X THREE , USING LEFT INTERNAL MAMMARY ARTERY AND RIGHT LEG GREATER SAPHENOUS VEIN HARVESTED ENDOSCOPICALLY (N/A) TRANSESOPHAGEAL ECHOCARDIOGRAM (TEE) (N/A) Subjective:  Awake and alert. Cough has cleared and bowel function returned. Doing well with ambulation and pain is controlled.  He would like to go home today.   Objective: Vital signs in last 24 hours: Temp:  [98 F (36.7 C)-98.5 F (36.9 C)] 98 F (36.7 C) (03/30 0736) Pulse Rate:  [100-107] 100 (03/30 0736) Cardiac Rhythm: Sinus tachycardia (03/30 0743) Resp:  [17-20] 17 (03/30 0736) BP: (124-138)/(77-91) 138/89 (03/30 0736) SpO2:  [95 %-97 %] 96 % (03/30 0736) Weight:  [106.7 kg] 106.7 kg (03/30 0404)     Intake/Output from previous day: 03/29 0701 - 03/30 0700 In: 0  Out: 850 [Urine:850] Intake/Output this shift: No intake/output data recorded.  General appearance: alert, cooperative and no distress Neurologic: intact Heart: SR ~100/min  Lungs: breath sounds are clear Abdomen: Soft and nontender Extremities: Warm and well-perfused.  Right lower extremity EVH incision is intact and dry.   Wound: The sternotomy incision is intact and dry.  Lab Results: Recent Labs    11/12/20 0115 11/13/20 0140  WBC 16.7* 14.0*  HGB 8.4* 8.3*  HCT 25.2* 24.4*  PLT 223 329   BMET:  Recent Labs    11/12/20 0115 11/13/20 0140  NA 134* 136  K 4.3 3.8  CL 101 103  CO2 28 27  GLUCOSE 115* 113*  BUN 24* 21*  CREATININE 1.28* 1.03  CALCIUM 8.2* 8.2*    PT/INR:  No results for input(s): LABPROT, INR in the last 72 hours. ABG    Component Value Date/Time   PHART 7.314 (L) 11/09/2020 1910   HCO3 21.8 11/09/2020 1910   TCO2 23 11/09/2020 1910   ACIDBASEDEF 4.0 (H) 11/09/2020 1910   O2SAT 96.0 11/09/2020 1910   CBG (last 3)  Recent Labs     11/11/20 1140 11/11/20 1716 11/11/20 2128  GLUCAP 116* 104* 129*    Assessment/Plan: S/P Procedure(s) (LRB): CORONARY ARTERY BYPASS GRAFTING (CABG) X THREE , USING LEFT INTERNAL MAMMARY ARTERY AND RIGHT LEG GREATER SAPHENOUS VEIN HARVESTED ENDOSCOPICALLY (N/A) TRANSESOPHAGEAL ECHOCARDIOGRAM (TEE) (N/A)  -Postop day 5 CABG x3 for multivessel coronary artery disease presenting with acute ST elevation myocardial infarction that was aborted with successful PTCI of the RCA. Preop EF~45%.  Progressing well, stable cardiac rhythm.  He is also doing well with mobility.  Continue aspirin, metoprolol, atorvastatin.  Added clopidogrel for acute MI preop.    -Expected acute blood loss anemia: Hematocrit reasonably stable.  He is tolerating this well.  Trinsicon added  -Hypertension with sinus tachycrdia: He was not taking any medications prior to surgery. BP has been trending up,   Will increase metoprolol to 50 mg p.o. twice daily.  -Postoperative volume excess.  Weight still about 2 kg above preop.  He does not appear edematous on exam.  Continue diuresis with oral Lasix for a few days after discharge.  -Cough, thick secretions- this has resolved. Encouraged to use the IS for another week at home.    -Disposition:  discharge to home today. Instructions given.     LOS: 7 days    Leary Roca, [place] [phone] 11/14/2020

## 2020-11-14 NOTE — Progress Notes (Signed)
CARDIAC REHAB PHASE I   D/c education completed with pt. Pt educated on importance of site care and monitoring incision daily. Encourage continued IS use, walks, and sternal precautions. Pt given in-the-tube sheet along with heart healthy diet. Reviewed restrictions and exercise guidelines. Pt states he has necessary DME at home, plans to stay with his mom for at least a week. Will refer to CRP II .  [id number] Reynold Bowen, RN BSN 11/14/2020 9:33 AM

## 2020-11-14 NOTE — Progress Notes (Signed)
Patient given discharge instructions. Niece present. Telemetry box removed, CCMD notified. PIV removed. Personal belongings taken with patient to vehicle in wheelchair by Student Nurse.   Kenard Gower, RN

## 2020-11-16 ENCOUNTER — Telehealth (HOSPITAL_COMMUNITY): Payer: Self-pay

## 2020-11-16 NOTE — Telephone Encounter (Signed)
Per phase I, fax pt cardiac rehab referral to [facility].

## 2020-11-19 ENCOUNTER — Other Ambulatory Visit: Payer: Self-pay

## 2020-11-19 ENCOUNTER — Ambulatory Visit (INDEPENDENT_AMBULATORY_CARE_PROVIDER_SITE_OTHER): Payer: Self-pay | Admitting: *Deleted

## 2020-11-19 DIAGNOSIS — Z4802 Encounter for removal of sutures: Secondary | ICD-10-CM

## 2020-11-19 NOTE — Progress Notes (Signed)
Patient arrived for nurse visit to remove sutures post-CABG 11/09/20 by Dr. Cornelius Moras.  Four sutures removed with no signs or symptoms of infection noted.  Right two incisions slightly agape, left two incisios well approximated.  Patient tolerated suture removal well.  Patient instructed to keep the incision site clean and dry.  Instructed patient to call if incisions become red, begin draining purulent discharge, or if he develops a fever. Patient acknowledged instructions given.  All questions answered.

## 2020-11-26 ENCOUNTER — Encounter: Payer: Self-pay | Admitting: Cardiology

## 2020-11-26 ENCOUNTER — Ambulatory Visit (INDEPENDENT_AMBULATORY_CARE_PROVIDER_SITE_OTHER): Payer: 59 | Admitting: Cardiology

## 2020-11-26 ENCOUNTER — Other Ambulatory Visit: Payer: Self-pay

## 2020-11-26 VITALS — BP 128/74 | HR 78 | Ht 67.0 in | Wt 220.6 lb

## 2020-11-26 DIAGNOSIS — R5383 Other fatigue: Secondary | ICD-10-CM

## 2020-11-26 DIAGNOSIS — I2510 Atherosclerotic heart disease of native coronary artery without angina pectoris: Secondary | ICD-10-CM | POA: Insufficient documentation

## 2020-11-26 DIAGNOSIS — Z951 Presence of aortocoronary bypass graft: Secondary | ICD-10-CM

## 2020-11-26 DIAGNOSIS — I10 Essential (primary) hypertension: Secondary | ICD-10-CM

## 2020-11-26 DIAGNOSIS — E7800 Pure hypercholesterolemia, unspecified: Secondary | ICD-10-CM

## 2020-11-26 DIAGNOSIS — E669 Obesity, unspecified: Secondary | ICD-10-CM

## 2020-11-26 HISTORY — DX: Atherosclerotic heart disease of native coronary artery without angina pectoris: I25.10

## 2020-11-26 LAB — CBC WITH DIFFERENTIAL/PLATELET
Basophils Absolute: 0 10*3/uL (ref 0.0–0.2)
Basos: 0 %
EOS (ABSOLUTE): 0.2 10*3/uL (ref 0.0–0.4)
Eos: 3 %
Hematocrit: 29.5 % — ABNORMAL LOW (ref 37.5–51.0)
Hemoglobin: 9.8 g/dL — ABNORMAL LOW (ref 13.0–17.7)
Immature Grans (Abs): 0 10*3/uL (ref 0.0–0.1)
Immature Granulocytes: 0 %
Lymphocytes Absolute: 1.2 10*3/uL (ref 0.7–3.1)
Lymphs: 17 %
MCH: 28.2 pg (ref 26.6–33.0)
MCHC: 33.2 g/dL (ref 31.5–35.7)
MCV: 85 fL (ref 79–97)
Monocytes Absolute: 0.6 10*3/uL (ref 0.1–0.9)
Monocytes: 9 %
Neutrophils Absolute: 4.8 10*3/uL (ref 1.4–7.0)
Neutrophils: 71 %
Platelets: 636 10*3/uL — ABNORMAL HIGH (ref 150–450)
RBC: 3.48 x10E6/uL — ABNORMAL LOW (ref 4.14–5.80)
RDW: 12.3 % (ref 11.6–15.4)
WBC: 6.9 10*3/uL (ref 3.4–10.8)

## 2020-11-26 LAB — BASIC METABOLIC PANEL
BUN/Creatinine Ratio: 13 (ref 9–20)
BUN: 14 mg/dL (ref 6–24)
CO2: 22 mmol/L (ref 20–29)
Calcium: 9.6 mg/dL (ref 8.7–10.2)
Chloride: 99 mmol/L (ref 96–106)
Creatinine, Ser: 1.1 mg/dL (ref 0.76–1.27)
Glucose: 103 mg/dL — ABNORMAL HIGH (ref 65–99)
Potassium: 5.1 mmol/L (ref 3.5–5.2)
Sodium: 139 mmol/L (ref 134–144)
eGFR: 78 mL/min/{1.73_m2} (ref >59)

## 2020-11-26 LAB — MAGNESIUM: Magnesium: 2.2 mg/dL (ref 1.6–2.3)

## 2020-11-26 MED ORDER — NITROGLYCERIN 0.4 MG SL SUBL: 0.4000 mg | SUBLINGUAL_TABLET | SUBLINGUAL | 6 refills | Status: DC | PRN

## 2020-11-26 NOTE — Progress Notes (Signed)
Cardiology Office Note:    Date:  11/26/2020   ID:  Jorge Laster., DOB 02-22-1961, MRN [id number]  PCP:  Patient, No Pcp Per (Inactive)  Cardiologist:  Garwin Brothers, MD   Referring MD: No ref. provider found    ASSESSMENT:    1. Essential hypertension, benign   2. Fatigue, unspecified type   3. Pure hypercholesterolemia   4. Coronary artery disease involving native coronary artery of native heart without angina pectoris   5. Obesity (BMI 35.0-39.9 without comorbidity)   6. S/P CABG x 3    PLAN:    In order of problems listed above:  1. Coronary artery disease post CABG surgery: Secondary prevention stressed with patient.  Importance of compliance with diet medication stressed and he vocalized understanding.  The graft harvest site and surgical sites are healing well.  He is walking appropriately without any symptoms.  He is happy with his progress. 2. Essential hypertension: Blood pressure stable and diet was emphasized. 3. Mixed dyslipidemia on statin therapy and we will monitor his lipids.  Diet was emphasized. 4. Obesity: Weight reduction stressed risks of obesity explained and he promises to do better. 5. He has an appointment coming up with his cardiac surgeon in the next few days.  I discussed cardiac rehab but is not keen on it I respect his wishes.  Benefits emphasized. 6. EKG reveals sinus rhythm with QT prolongation.  We will do a Chem-7 and magnesium level today.  He will be back in 1 month for fasting liver lipid check and will be seen in follow-up appointment in 2 months or earlier if he has any concerns.  I reviewed his records extensively and questions were answered to his satisfaction.   Medication Adjustments/Labs and Tests Ordered: Current medicines are reviewed at length with the patient today.  Concerns regarding medicines are outlined above.  Orders Placed This Encounter  Procedures  . Basic metabolic panel  . CBC with Differential/Platelet  .  Magnesium  . Basic metabolic panel  . Lipid panel  . Hepatic function panel  . EKG 12-Lead   Meds ordered this encounter  Medications  . nitroGLYCERIN (NITROSTAT) 0.4 MG SL tablet    Sig: Place 1 tablet (0.4 mg total) under the tongue every 5 (five) minutes as needed.    Dispense:  25 tablet    Refill:  6     No chief complaint on file.    History of Present Illness:    Jorge Runyon. is a 60 y.o. male.  Patient has past medical history of essential hypertension and dyslipidemia and obesity.  He had a STEMI with inferior wall myocardial infarction and was transferred from [facility] to [facility].  Patient underwent angioplasty of the RCA as the culprit vessel.  He was found to have multivessel disease and therefore underwent CABG surgery.  Subsequently is done fine.  He takes care of activities of daily living without any problem.  No chest pain orthopnea or PND.  He is getting to be active.  He is happy with his progress.  At the time of my evaluation, the patient is alert awake oriented and in no distress.  Past Medical History:  Diagnosis Date  . Central retinal vein occlusion of right eye 03/18/2013   [name]  . ED (erectile dysfunction)   . Hyperlipidemia   . Hypertension   . S/P CABG x 3 11/09/2020   LIMA to LAD, SVG to OM, SVG to PDA, EVH via  right thigh    Past Surgical History:  Procedure Laterality Date  . CORONARY ARTERY BYPASS GRAFT N/A 11/09/2020   Procedure: CORONARY ARTERY BYPASS GRAFTING (CABG) X THREE , USING LEFT INTERNAL MAMMARY ARTERY AND RIGHT LEG GREATER SAPHENOUS VEIN HARVESTED ENDOSCOPICALLY;  Surgeon: Purcell Nailswen, Clarence H, MD;  [facility];  Service: Open Heart Surgery;  Laterality: N/A;  . CORONARY/GRAFT ACUTE MI REVASCULARIZATION N/A 11/07/2020   Procedure: Coronary/Graft Acute MI Revascularization;  Surgeon: Tonny Bollmanooper, Michael, MD;  [facility];  Service: Cardiovascular;  Laterality: N/A;  . LEFT HEART CATH AND CORONARY  ANGIOGRAPHY N/A 11/07/2020   Procedure: LEFT HEART CATH AND CORONARY ANGIOGRAPHY;  Surgeon: Tonny Bollmanooper, Michael, MD;  [facility];  Service: Cardiovascular;  Laterality: N/A;  . TEE WITHOUT CARDIOVERSION N/A 11/09/2020   Procedure: TRANSESOPHAGEAL ECHOCARDIOGRAM (TEE);  Surgeon: Purcell Nailswen, Clarence H, MD;  [facility];  Service: Open Heart Surgery;  Laterality: N/A;  . TONSILLECTOMY      Current Medications: Current Meds  Medication Sig  . aspirin EC 81 MG tablet Take 81 mg by mouth daily. Swallow whole.  Marland Kitchen. atorvastatin (LIPITOR) 80 MG tablet Take 1 tablet (80 mg total) by mouth daily.  . clopidogrel (PLAVIX) 75 MG tablet Take 1 tablet (75 mg total) by mouth daily.  . Melatonin 10 MG TABS Take 10 mg by mouth at bedtime as needed (sleep).  . metoprolol tartrate (LOPRESSOR) 50 MG tablet Take 1 tablet (50 mg total) by mouth 2 (two) times daily.  . nitroGLYCERIN (NITROSTAT) 0.4 MG SL tablet Place 1 tablet (0.4 mg total) under the tongue every 5 (five) minutes as needed.  . traMADol (ULTRAM) 50 MG tablet Take 50-100 mg by mouth as needed for moderate pain.  Marland Kitchen. VITAMIN D PO Take 1 capsule by mouth daily.     Allergies:   Patient has no known allergies.   Social History   Socioeconomic History  . Marital status: Single    Spouse name: Not on file  . Number of children: Not on file  . Years of education: Not on file  . Highest education level: Not on file  Occupational History  . Not on file  Tobacco Use  . Smoking status: Former Smoker    Years: 20.00    Types: Cigars    Quit date: 08/18/2000    Years since quitting: 20.2  . Smokeless tobacco: Never Used  . Tobacco comment: smoked on/off  Substance and Sexual Activity  . Alcohol use: Yes    Alcohol/week: 10.0 standard drinks    Types: 10 Glasses of wine per week    Comment: beer and wine  . Drug use: No  . Sexual activity: Yes  Other Topics Concern  . Not on file  Social History Narrative   Marital status: single;  dating.      Children: none      Employment:  Insurance account managerafety and compliance manager with [employer]       Tobacco: none       Alcohol:  1-2 beers and wine per day.  No DWIs.      Drugs: none       Exercise: none      Seatbelt:  100%      Guns:  Loaded guns.        Sexual activity: sexually active; no STDs; last STD screening 07/2013.  Total sexual partners = 40s.    75% of time uses condoms.  Females.  Social Determinants of Health   Financial Resource Strain: Not on file  Food Insecurity: Not on file  Transportation Needs: Not on file  Physical Activity: Not on file  Stress: Not on file  Social Connections: Not on file     Family History: The patient's family history includes Asthma in his sister; Cancer in his paternal grandfather and paternal grandmother; Heart disease in his maternal grandmother; Heart disease (age of onset: 90) in his father; Hyperlipidemia in his father; Hypertension in his father, maternal grandmother, mother, and paternal uncle; Mental illness in his father; Stroke in his father, maternal grandmother, and paternal uncle.  ROS:   Please see the history of present illness.    All other systems reviewed and are negative.  EKGs/Labs/Other Studies Reviewed:    The following studies were reviewed today: Tonny Bollman, MD (Primary)      Procedures  Coronary/Graft Acute MI Revascularization  LEFT HEART CATH AND CORONARY ANGIOGRAPHY   Conclusion    Prox RCA to Mid RCA lesion is 40% stenosed.  Dist RCA lesion is 95% stenosed.  Mid LM to Dist LM lesion is 70% stenosed.  Mid LAD lesion is 100% stenosed.  1st Mrg lesion is 65% stenosed.  There is mild to moderate left ventricular systolic dysfunction.  LV end diastolic pressure is normal.  The left ventricular ejection fraction is 45-50% by visual estimate.  Balloon angioplasty was performed using a BALLOON SAPPHIRE 3.0X12.  Post intervention, there is a 35% residual stenosis.   1.  Severe  mid to distal left main stenosis 2.  Chronic total occlusion of the LAD just after the first diagonal branch with the mid and distal vessel filling late via left to left collaterals 3.  Moderate mid circumflex stenosis into a large caliber first obtuse marginal vessel 4.  Critical stenosis of the distal RCA (culprit lesion), treated successfully with balloon angioplasty using a 3.0 x 12 mm balloon 5.  Mild to moderate segmental left ventricular systolic dysfunction with anterolateral hypokinesis and basal inferior hypokinesis of the LV, estimated LVEF 45%  Recommendations: Because of the patient's multivessel disease with left main stenosis, I reviewed the patient's cardiac catheterization films with Dr. Cornelius Moras.  We discussed treatment options and agreed that primary PCI to stabilize the patient's distal RCA is appropriate after this patient was loaded with 600 mg clopidogrel this morning.  He has surgical coronary anatomy, but will have reduced bleeding risk once Plavix washout occurs.  Other lesions appear stable/chronic based on angiographic features.  Will initiate IV tirofiban and patient will have cardiac surgical consultation for consideration of multivessel CABG during his index hospitalization.       Recent Labs: 11/08/2020: ALT 36 11/10/2020: Magnesium 2.3 11/13/2020: BUN 21; Creatinine, Ser 1.03; Hemoglobin 8.3; Platelets 329; Potassium 3.8; Sodium 136  Recent Lipid Panel    Component Value Date/Time   CHOL 211 (H) 11/08/2020 0139   TRIG 126 11/08/2020 0139   HDL 37 (L) 11/08/2020 0139   CHOLHDL 5.7 11/08/2020 0139   VLDL 25 11/08/2020 0139   LDLCALC 149 (H) 11/08/2020 0139    Physical Exam:    VS:  BP 128/74   Pulse 78   Ht 5\' 7"  (1.702 m)   Wt 220 lb 9.6 oz (100.1 kg)   SpO2 97%   BMI 34.55 kg/m     Wt Readings from Last 3 Encounters:  11/26/20 220 lb 9.6 oz (100.1 kg)  11/14/20 235 lb 4.8 oz (106.7 kg)  01/18/14 210 lb (95.3  kg)     GEN: Patient is in no acute  distress HEENT: Normal NECK: No JVD; No carotid bruits LYMPHATICS: No lymphadenopathy CARDIAC: Hear sounds regular, 2/6 systolic murmur at the apex. RESPIRATORY:  Clear to auscultation without rales, wheezing or rhonchi  ABDOMEN: Soft, non-tender, non-distended MUSCULOSKELETAL:  No edema; No deformity.  The surgical sites and graft harvest site is healing well. SKIN: Warm and dry NEUROLOGIC:  Alert and oriented x 3 PSYCHIATRIC:  Normal affect   Signed, Garwin Brothers, MD  11/26/2020 9:48 AM    [facility]

## 2020-11-26 NOTE — Patient Instructions (Signed)
Medication Instructions:  Your physician has recommended you make the following change in your medication:   Use Nitroglycerin as needed for chest pain.  *If you need a refill on your cardiac medications before your next appointment, please call your pharmacy*   Lab Work: Your physician recommends that you have labs done in the office today. Your test included  basic metabolic panel, complete blood count and magnesium.  Your physician recommends that you return for lab work in: 1 month. You need to have labs done when you are fasting.  You can come Monday through Friday 8:30 am to 12:00 pm and 1:15 to 4:30. You do not need to make an appointment as the order has already been placed. The labs you are going to have done are BMET, LFT and Lipids.  If you have labs (blood work) drawn today and your tests are completely normal, you will receive your results only by: Marland Kitchen MyChart Message (if you have MyChart) OR . A paper copy in the mail If you have any lab test that is abnormal or we need to change your treatment, we will call you to review the results.   Testing/Procedures: None ordered   Follow-Up: At [facility], you and your health needs are our priority.  As part of our continuing mission to provide you with exceptional heart care, we have created designated Provider Care Teams.  These Care Teams include your primary Cardiologist (physician) and Advanced Practice Providers (APPs -  Physician Assistants and Nurse Practitioners) who all work together to provide you with the care you need, when you need it.  We recommend signing up for the patient portal called "MyChart".  Sign up information is provided on this After Visit Summary.  MyChart is used to connect with patients for Virtual Visits (Telemedicine).  Patients are able to view lab/test results, encounter notes, upcoming appointments, etc.  Non-urgent messages can be sent to your provider as well.   To learn more about what you can  do with MyChart, go to [email or web address].    Your next appointment:   2 month(s)  The format for your next appointment:   In Person  Provider:   Belva Crome, MD   Other Instructions Nitroglycerin sublingual tablets What is this medicine? NITROGLYCERIN (nye troe GLI ser in) is a type of vasodilator. It relaxes blood vessels, increasing the blood and oxygen supply to your heart. This medicine is used to relieve chest pain caused by angina. It is also used to prevent chest pain before activities like climbing stairs, going outdoors in cold weather, or sexual activity. This medicine may be used for other purposes; ask your health care provider or pharmacist if you have questions. COMMON BRAND NAME(S): Nitroquick, Nitrostat, Nitrotab What should I tell my health care provider before I take this medicine? They need to know if you have any of these conditions:  anemia  head injury, recent stroke, or bleeding in the brain  liver disease  previous heart attack  an unusual or allergic reaction to nitroglycerin, other medicines, foods, dyes, or preservatives  pregnant or trying to get pregnant  breast-feeding How should I use this medicine? Take this medicine by mouth as needed. Use at the first sign of an angina attack (chest pain or tightness). You can also take this medicine 5 to 10 minutes before an event likely to produce chest pain. Follow the directions exactly as written on the prescription label. Place one tablet under your tongue and let it dissolve. Do  not swallow whole. Replace the dose if you accidentally swallow it. It will help if your mouth is not dry. Saliva around the tablet will help it to dissolve more quickly. Do not eat or drink, smoke or chew tobacco while a tablet is dissolving. Sit down when taking this medicine. In an angina attack, you should feel better within 5 minutes after your first dose. You can take a dose every 5 minutes up to a total of 3 doses.  If you do not feel better or feel worse after 1 dose, call 9-1-1 at once. Do not take more than 3 doses in 15 minutes. Your health care provider might give you other directions. Follow those directions if he or she does. Do not take your medicine more often than directed. Talk to your health care provider about the use of this medicine in children. Special care may be needed. Overdosage: If you think you have taken too much of this medicine contact a poison control center or emergency room at once. NOTE: This medicine is only for you. Do not share this medicine with others. What if I miss a dose? This does not apply. This medicine is only used as needed. What may interact with this medicine? Do not take this medicine with any of the following medications:  certain migraine medicines like ergotamine and dihydroergotamine (DHE)  medicines used to treat erectile dysfunction like sildenafil, tadalafil, and vardenafil  riociguat This medicine may also interact with the following medications:  alteplase  aspirin  heparin  medicines for high blood pressure  medicines for mental depression  other medicines used to treat angina  phenothiazines like chlorpromazine, mesoridazine, prochlorperazine, thioridazine This list may not describe all possible interactions. Give your health care provider a list of all the medicines, herbs, non-prescription drugs, or dietary supplements you use. Also tell them if you smoke, drink alcohol, or use illegal drugs. Some items may interact with your medicine. What should I watch for while using this medicine? Tell your doctor or health care professional if you feel your medicine is no longer working. Keep this medicine with you at all times. Sit or lie down when you take your medicine to prevent falling if you feel dizzy or faint after using it. Try to remain calm. This will help you to feel better faster. If you feel dizzy, take several deep breaths and lie down  with your feet propped up, or bend forward with your head resting between your knees. You may get drowsy or dizzy. Do not drive, use machinery, or do anything that needs mental alertness until you know how this drug affects you. Do not stand or sit up quickly, especially if you are an older patient. This reduces the risk of dizzy or fainting spells. Alcohol can make you more drowsy and dizzy. Avoid alcoholic drinks. Do not treat yourself for coughs, colds, or pain while you are taking this medicine without asking your doctor or health care professional for advice. Some ingredients may increase your blood pressure. What side effects may I notice from receiving this medicine? Side effects that you should report to your doctor or health care professional as soon as possible:  allergic reactions (skin rash, itching or hives; swelling of the face, lips, or tongue)  low blood pressure (dizziness; feeling faint or lightheaded, falls; unusually weak or tired)  low red blood cell counts (trouble breathing; feeling faint; lightheaded, falls; unusually weak or tired) Side effects that usually do not require medical attention (report to  your doctor or health care professional if they continue or are bothersome):  facial flushing (redness)  headache  nausea, vomiting This list may not describe all possible side effects. Call your doctor for medical advice about side effects. You may report side effects to FDA at 1-800-FDA-1088. Where should I keep my medicine? Keep out of the reach of children. Store at room temperature between 20 and 25 degrees C (68 and 77 degrees F). Store in Retail buyer. Protect from light and moisture. Keep tightly closed. Throw away any unused medicine after the expiration date. NOTE: This sheet is a summary. It may not cover all possible information. If you have questions about this medicine, talk to your doctor, pharmacist, or health care provider.  2021 Elsevier/Gold  Standard (2018-05-05 16:46:32)

## 2020-11-27 ENCOUNTER — Telehealth: Payer: Self-pay

## 2020-11-27 NOTE — Telephone Encounter (Signed)
Patient contacted the office requesting to drive.  He is s/p CABG x3 with Dr. Cornelius Moras 11/09/20 and was discharged from the hospital 11/13/20. Patient does have a follow-up appointment with Dr. Cornelius Moras 12/10/20.  Advised to wait for approval on driving until he is seen in the clinic by Dr. Cornelius Moras.  Advised that sternal union is priority.  Also advised that he needed to be off of all pain narcotics before he would have the go-ahead to drive.  He acknowledged receipt and is aware of his follow-up appointment.

## 2020-11-30 ENCOUNTER — Telehealth: Payer: Self-pay | Admitting: Cardiology

## 2020-11-30 NOTE — Telephone Encounter (Signed)
Spoke with patient regarding results and recommendation.  Patient verbalizes understanding and is agreeable to plan of care. Advised patient to call back with any issues or concerns.  

## 2020-11-30 NOTE — Telephone Encounter (Signed)
Patient is returning call to discuss lab results. 

## 2020-11-30 NOTE — Telephone Encounter (Signed)
Please return call his his cell phone. He states he is about to leave his house for the weekend.

## 2020-12-07 ENCOUNTER — Other Ambulatory Visit: Payer: Self-pay | Admitting: Thoracic Surgery (Cardiothoracic Vascular Surgery)

## 2020-12-07 DIAGNOSIS — Z951 Presence of aortocoronary bypass graft: Secondary | ICD-10-CM

## 2020-12-11 ENCOUNTER — Other Ambulatory Visit: Payer: Self-pay

## 2020-12-11 ENCOUNTER — Ambulatory Visit
Admission: RE | Admit: 2020-12-11 | Discharge: 2020-12-11 | Disposition: A | Discharge status: Home / Self Care | Payer: 59 | Source: Ambulatory Visit | Attending: Thoracic Surgery (Cardiothoracic Vascular Surgery) | Admitting: Thoracic Surgery (Cardiothoracic Vascular Surgery)

## 2020-12-11 ENCOUNTER — Ambulatory Visit (INDEPENDENT_AMBULATORY_CARE_PROVIDER_SITE_OTHER): Payer: Self-pay | Admitting: Physician Assistant

## 2020-12-11 VITALS — BP 122/78 | HR 86 | Resp 20 | Ht 67.0 in | Wt 217.0 lb

## 2020-12-11 DIAGNOSIS — I2510 Atherosclerotic heart disease of native coronary artery without angina pectoris: Secondary | ICD-10-CM

## 2020-12-11 DIAGNOSIS — Z951 Presence of aortocoronary bypass graft: Secondary | ICD-10-CM

## 2020-12-11 NOTE — Patient Instructions (Signed)
You may return to driving an automobile as long as you are no longer requiring oral narcotic pain relievers during the daytime.  It would be wise to start driving only short distances during the daylight and gradually increase from there as you feel comfortable.  Make every effort to stay physically active, get some type of exercise on a regular basis, and stick to a "heart healthy diet".  The long term benefits for regular exercise and a healthy diet are critically important to your overall health and wellbeing.  You may continue to gradually increase your physical activity as tolerated.  Refrain from any heavy lifting or strenuous use of your arms and shoulders until at least 8 weeks from the time of your surgery, and avoid activities that cause increased pain in your chest on the side of your surgical incision.  Otherwise you may continue to increase activities without any particular limitations.  Increase the intensity and duration of physical activity gradually.  

## 2020-12-11 NOTE — Progress Notes (Signed)
HPI: Patient returns for routine postoperative follow-up having undergone CABG x 3 on 11/09/2020. The patient's early postoperative recovery while in the hospital was unremarkable without complications.  Since hospital discharge the patient reports overall he feels great.  He feels he has done well other than having required the surgery in the first place.  He is no longer requiring pain medication.  He is active walking up to a mile per day.  His appetite has returned to normal and he has lost about 20 lbs overall since the surgery.  He does question when he can resume driving.  He also questions application of lotion/cream to surgical incisions.  He is not interested in cardiac rehab   Current Outpatient Medications  Medication Sig Dispense Refill  . aspirin EC 81 MG tablet Take 81 mg by mouth daily. Swallow whole.    Marland Kitchen atorvastatin (LIPITOR) 80 MG tablet Take 1 tablet (80 mg total) by mouth daily. 30 tablet 2  . clopidogrel (PLAVIX) 75 MG tablet Take 1 tablet (75 mg total) by mouth daily. 30 tablet 5  . Melatonin 10 MG TABS Take 10 mg by mouth at bedtime as needed (sleep).    . metoprolol tartrate (LOPRESSOR) 50 MG tablet Take 1 tablet (50 mg total) by mouth 2 (two) times daily. 60 tablet 2  . nitroGLYCERIN (NITROSTAT) 0.4 MG SL tablet Place 1 tablet (0.4 mg total) under the tongue every 5 (five) minutes as needed. 25 tablet 6  . VITAMIN D PO Take 1 capsule by mouth daily.    . traMADol (ULTRAM) 50 MG tablet Take 50-100 mg by mouth as needed for moderate pain. (Patient not taking: Reported on 12/11/2020)     No current facility-administered medications for this visit.    Physical Exam:  BP 122/78   Pulse 86   Resp 20   Ht 5\' 7"  (1.702 m)   Wt 217 lb (98.4 kg)   SpO2 98% Comment: RA  BMI 33.99 kg/m   Gen: no apparent distress Heart: RRR Lungs: CTA bilaterally Abd: soft non-tender, non-distended Ext: no edema Incisions: sternotomy well healed, EVH site healing w/o infection, CT sites  with eschar present no signs of infection  Diagnostic Tests:  CXR: post operative changes, trace bilateral effusions/atelectasis  A/P:  1. S/P CABG, doing well, BP controlled 2. Trace pleural effusions, should resolve w/o issue with time 3. Incision care- patient instructed he can apply creams at this time, instructed not to remove eschar from CT sites as these will resolve on their own. He was instructed he can add Vaseline to these sites to help initiate this 4. Activity- continue to increase as tolerated.  He may resume driving his vehicle.  He was instructed to wait a full 12 weeks from surgery to resume driving his motorcycle, he was again provided education on sternal precautions 5. Dispo- RTC prn, letter provided to proceed with dental procedure  Phelan Goers, PA-C     Marland Kitchen, PA-C [facility] [phone]

## 2020-12-28 ENCOUNTER — Encounter (INDEPENDENT_AMBULATORY_CARE_PROVIDER_SITE_OTHER): Payer: 59 | Admitting: Ophthalmology

## 2020-12-28 ENCOUNTER — Other Ambulatory Visit: Payer: Self-pay

## 2020-12-28 DIAGNOSIS — I10 Essential (primary) hypertension: Secondary | ICD-10-CM | POA: Diagnosis not present

## 2020-12-28 DIAGNOSIS — H34811 Central retinal vein occlusion, right eye, with macular edema: Secondary | ICD-10-CM

## 2020-12-28 DIAGNOSIS — H35033 Hypertensive retinopathy, bilateral: Secondary | ICD-10-CM

## 2020-12-28 DIAGNOSIS — H43813 Vitreous degeneration, bilateral: Secondary | ICD-10-CM | POA: Diagnosis not present

## 2021-01-07 ENCOUNTER — Telehealth: Payer: Self-pay | Admitting: Cardiology

## 2021-01-07 NOTE — Telephone Encounter (Signed)
New Message:     Pt wants to know if he needs lab work before his next appt on 03-04-21?

## 2021-01-07 NOTE — Telephone Encounter (Signed)
Called patient. Informed him that he does need repeat labs- was due to come this month. They are already ordered and he needs to be fasting he understands he will come this week.

## 2021-01-09 ENCOUNTER — Other Ambulatory Visit: Payer: Self-pay | Admitting: Physician Assistant

## 2021-01-09 LAB — LIPID PANEL
Chol/HDL Ratio: 4.2 ratio (ref 0.0–5.0)
Cholesterol, Total: 152 mg/dL (ref 100–199)
HDL: 36 mg/dL — ABNORMAL LOW (ref >39)
LDL Chol Calc (NIH): 87 mg/dL (ref 0–99)
Triglycerides: 170 mg/dL — ABNORMAL HIGH (ref 0–149)
VLDL Cholesterol Cal: 29 mg/dL (ref 5–40)

## 2021-01-09 LAB — BASIC METABOLIC PANEL
BUN/Creatinine Ratio: 19 (ref 9–20)
BUN: 19 mg/dL (ref 6–24)
CO2: 22 mmol/L (ref 20–29)
Calcium: 9.6 mg/dL (ref 8.7–10.2)
Chloride: 100 mmol/L (ref 96–106)
Creatinine, Ser: 0.99 mg/dL (ref 0.76–1.27)
Glucose: 105 mg/dL — ABNORMAL HIGH (ref 65–99)
Potassium: 4.5 mmol/L (ref 3.5–5.2)
Sodium: 138 mmol/L (ref 134–144)
eGFR: 88 mL/min/{1.73_m2} (ref >59)

## 2021-01-09 LAB — HEPATIC FUNCTION PANEL
ALT: 32 IU/L (ref 0–44)
AST: 26 IU/L (ref 0–40)
Albumin: 4.4 g/dL (ref 3.8–4.9)
Alkaline Phosphatase: 81 IU/L (ref 44–121)
Bilirubin Total: <0.2 mg/dL (ref 0.0–1.2)
Bilirubin, Direct: <0.1 mg/dL (ref 0.00–0.40)
Total Protein: 6.6 g/dL (ref 6.0–8.5)

## 2021-01-10 ENCOUNTER — Other Ambulatory Visit: Payer: Self-pay

## 2021-01-10 MED ORDER — ATORVASTATIN CALCIUM 80 MG PO TABS: 80.0000 mg | ORAL_TABLET | Freq: Every day | ORAL | 3 refills | Status: DC

## 2021-01-10 MED ORDER — METOPROLOL TARTRATE 50 MG PO TABS: 50.0000 mg | ORAL_TABLET | Freq: Two times a day (BID) | ORAL | 3 refills | Status: DC

## 2021-01-10 NOTE — Telephone Encounter (Signed)
While calling lab results pt states that his prescription refills were rejected. Originally filled by PA in [place]. Refills have been sent by myself for continuation of medication.

## 2021-02-05 ENCOUNTER — Ambulatory Visit: Payer: 59 | Admitting: Cardiology

## 2021-03-01 ENCOUNTER — Other Ambulatory Visit: Payer: Self-pay

## 2021-03-01 DIAGNOSIS — E785 Hyperlipidemia, unspecified: Secondary | ICD-10-CM | POA: Insufficient documentation

## 2021-03-01 DIAGNOSIS — I10 Essential (primary) hypertension: Secondary | ICD-10-CM | POA: Insufficient documentation

## 2021-03-04 ENCOUNTER — Encounter: Payer: Self-pay | Admitting: Cardiology

## 2021-03-04 ENCOUNTER — Other Ambulatory Visit: Payer: Self-pay

## 2021-03-04 ENCOUNTER — Ambulatory Visit (INDEPENDENT_AMBULATORY_CARE_PROVIDER_SITE_OTHER): Payer: 59 | Admitting: Cardiology

## 2021-03-04 VITALS — BP 124/76 | HR 62 | Ht 67.0 in | Wt 220.8 lb

## 2021-03-04 DIAGNOSIS — Z951 Presence of aortocoronary bypass graft: Secondary | ICD-10-CM

## 2021-03-04 DIAGNOSIS — E7800 Pure hypercholesterolemia, unspecified: Secondary | ICD-10-CM

## 2021-03-04 DIAGNOSIS — I2510 Atherosclerotic heart disease of native coronary artery without angina pectoris: Secondary | ICD-10-CM | POA: Diagnosis not present

## 2021-03-04 DIAGNOSIS — I10 Essential (primary) hypertension: Secondary | ICD-10-CM

## 2021-03-04 NOTE — Progress Notes (Signed)
Cardiology Office Note:    Date:  03/04/2021   ID:  Jorge Molina., DOB Feb 15, 1961, MRN [id number]  PCP:  Patient, No Pcp Per (Inactive)  Cardiologist:  Garwin Brothers, MD   Referring MD: No ref. provider found    ASSESSMENT:    1. Essential hypertension, benign   2. Pure hypercholesterolemia   3. Coronary artery disease involving native coronary artery of native heart without angina pectoris   4. S/P CABG x 3    PLAN:    In order of problems listed above:  Coronary artery disease post CABG surgery: Secondary prevention stressed with the patient.  Importance of compliance with diet medication stressed and he vocalized understanding.  He has excellent effort tolerance and is happy about it. Essential hypertension: Blood pressure stable and diet was emphasized. Mixed dyslipidemia: Lipids were reviewed and they are fine at this time. Obesity: Weight reduction was stressed.  Lifestyle modification was urged the patient that he was best to do better. Patient will be seen in follow-up appointment in 6 months or earlier if the patient has any concerns    Medication Adjustments/Labs and Tests Ordered: Current medicines are reviewed at length with the patient today.  Concerns regarding medicines are outlined above.  No orders of the defined types were placed in this encounter.  No orders of the defined types were placed in this encounter.    No chief complaint on file.    History of Present Illness:    Jorge Molina. is a 60 y.o. male.  Patient has past medical history of coronary artery disease post CABG surgery, essential hypertension and dyslipidemia.  He denies any problems at this time and takes care of activities of daily living.  No chest pain orthopnea or PND.  He walks about a mile and 1/2 to 2 miles on a daily basis.  At the time of my evaluation, the patient is alert awake oriented and in no distress.  Past Medical History:  Diagnosis Date   BPH (benign  prostatic hypertrophy) 03/16/2012   CAD (coronary artery disease) 11/26/2020   Central retinal vein occlusion of right eye 03/18/2013   Jorge Molina   Erectile dysfunction 09/21/2013   Essential hypertension, benign 08/04/2013   Fatigue 03/16/2012   Hyperlipidemia    Hypertension    Obesity (BMI 35.0-39.9 without comorbidity) 03/16/2012   Pure hypercholesterolemia 08/04/2013   S/P CABG x 3 11/09/2020   LIMA to LAD, SVG to OM, SVG to PDA, [facility] via right thigh   STEMI involving right coronary artery (HCC) 11/07/2020    Past Surgical History:  Procedure Laterality Date   CORONARY ARTERY BYPASS GRAFT N/A 11/09/2020   Procedure: CORONARY ARTERY BYPASS GRAFTING (CABG) X THREE , USING LEFT INTERNAL MAMMARY ARTERY AND RIGHT LEG GREATER SAPHENOUS VEIN HARVESTED ENDOSCOPICALLY;  Surgeon: Purcell Nails, MD;  Location: [facility] OR;  Service: Open Heart Surgery;  Laterality: N/A;   CORONARY/GRAFT ACUTE MI REVASCULARIZATION N/A 11/07/2020   Procedure: Coronary/Graft Acute MI Revascularization;  Surgeon: Tonny Bollman, MD;  Location: [facility] INVASIVE CV LAB;  Service: Cardiovascular;  Laterality: N/A;   LEFT HEART CATH AND CORONARY ANGIOGRAPHY N/A 11/07/2020   Procedure: LEFT HEART CATH AND CORONARY ANGIOGRAPHY;  Surgeon: Tonny Bollman, MD;  Location: [facility] INVASIVE CV LAB;  Service: Cardiovascular;  Laterality: N/A;   TEE WITHOUT CARDIOVERSION N/A 11/09/2020   Procedure: TRANSESOPHAGEAL ECHOCARDIOGRAM (TEE);  Surgeon: Purcell Nails, MD;  Location: [facility] OR;  Service: Open Heart Surgery;  Laterality: N/A;   TONSILLECTOMY  Current Medications: Current Meds  Medication Sig   aspirin EC 81 MG tablet Take 81 mg by mouth daily. Swallow whole.   atorvastatin (LIPITOR) 80 MG tablet Take 1 tablet (80 mg total) by mouth daily.   clopidogrel (PLAVIX) 75 MG tablet Take 1 tablet (75 mg total) by mouth daily.   Melatonin 10 MG TABS Take 10 mg by mouth at bedtime as needed for sleep (sleep).   metoprolol tartrate (LOPRESSOR) 50  MG tablet Take 1 tablet (50 mg total) by mouth 2 (two) times daily.   nitroGLYCERIN (NITROSTAT) 0.4 MG SL tablet Place 0.4 mg under the tongue every 5 (five) minutes as needed for chest pain.   traMADol (ULTRAM) 50 MG tablet Take 50-100 mg by mouth as needed for moderate pain.   VITAMIN D PO Take 1 capsule by mouth daily.     Allergies:   Patient has no known allergies.   Social History   Socioeconomic History   Marital status: Single    Spouse name: Not on file   Number of children: Not on file   Years of education: Not on file   Highest education level: Not on file  Occupational History   Not on file  Tobacco Use   Smoking status: Former    Types: Cigars    Quit date: 08/18/2000    Years since quitting: 20.5   Smokeless tobacco: Never   Tobacco comments:    smoked on/off  Substance and Sexual Activity   Alcohol use: Yes    Alcohol/week: 10.0 standard drinks    Types: 10 Glasses of wine per week    Comment: beer and wine   Drug use: No   Sexual activity: Yes  Other Topics Concern   Not on file  Social History Narrative   Marital status: single; dating.      Children: none      Employment:  Insurance account manager       Tobacco: none       Alcohol:  1-2 beers and wine per day.  No DWIs.      Drugs: none       Exercise: none      Seatbelt:  100%      Guns:  Loaded guns.        Sexual activity: sexually active; no STDs; last STD screening 07/2013.  Total sexual partners = 40s.    75% of time uses condoms.  Females.          Social Determinants of Health   Financial Resource Strain: Not on file  Food Insecurity: Not on file  Transportation Needs: Not on file  Physical Activity: Not on file  Stress: Not on file  Social Connections: Not on file     Family History: The patient's family history includes Asthma in his sister; Cancer in his paternal grandfather and paternal grandmother; Heart disease in his maternal grandmother; Heart disease (age  of onset: 68) in his father; Hyperlipidemia in his father; Hypertension in his father, maternal grandmother, mother, and paternal uncle; Mental illness in his father; Stroke in his father, maternal grandmother, and paternal uncle.  ROS:   Please see the history of present illness.    All other systems reviewed and are negative.  EKGs/Labs/Other Studies Reviewed:    The following studies were reviewed today: I discussed my findings with the patient extensively.   Recent Labs: 11/26/2020: Hemoglobin 9.8; Magnesium 2.2; Platelets 636 01/09/2021: ALT 32; BUN 19; Creatinine, Ser 0.99;  Potassium 4.5; Sodium 138  Recent Lipid Panel    Component Value Date/Time   CHOL 152 01/09/2021 0829   TRIG 170 (H) 01/09/2021 0829   HDL 36 (L) 01/09/2021 0829   CHOLHDL 4.2 01/09/2021 0829   CHOLHDL 5.7 11/08/2020 0139   VLDL 25 11/08/2020 0139   LDLCALC 87 01/09/2021 0829    Physical Exam:    VS:  BP 124/76   Pulse 62   Ht 5\' 7"  (1.702 m)   Wt 220 lb 12.8 oz (100.2 kg)   SpO2 96%   BMI 34.58 kg/m     Wt Readings from Last 3 Encounters:  03/04/21 220 lb 12.8 oz (100.2 kg)  12/11/20 217 lb (98.4 kg)  11/26/20 220 lb 9.6 oz (100.1 kg)     GEN: Patient is in no acute distress HEENT: Normal NECK: No JVD; No carotid bruits LYMPHATICS: No lymphadenopathy CARDIAC: Hear sounds regular, 2/6 systolic murmur at the apex. RESPIRATORY:  Clear to auscultation without rales, wheezing or rhonchi  ABDOMEN: Soft, non-tender, non-distended MUSCULOSKELETAL:  No edema; No deformity  SKIN: Warm and dry NEUROLOGIC:  Alert and oriented x 3 PSYCHIATRIC:  Normal affect   Signed, 01/26/21, MD  03/04/2021 3:14 PM    [facility]

## 2021-03-04 NOTE — Patient Instructions (Signed)

## 2021-04-04 ENCOUNTER — Encounter (INDEPENDENT_AMBULATORY_CARE_PROVIDER_SITE_OTHER): Payer: 59 | Admitting: Ophthalmology

## 2021-04-04 ENCOUNTER — Other Ambulatory Visit: Payer: Self-pay

## 2021-04-04 DIAGNOSIS — I10 Essential (primary) hypertension: Secondary | ICD-10-CM | POA: Diagnosis not present

## 2021-04-04 DIAGNOSIS — H35033 Hypertensive retinopathy, bilateral: Secondary | ICD-10-CM | POA: Diagnosis not present

## 2021-04-04 DIAGNOSIS — H348322 Tributary (branch) retinal vein occlusion, left eye, stable: Secondary | ICD-10-CM

## 2021-04-04 DIAGNOSIS — H2513 Age-related nuclear cataract, bilateral: Secondary | ICD-10-CM

## 2021-04-04 DIAGNOSIS — H43813 Vitreous degeneration, bilateral: Secondary | ICD-10-CM

## 2021-04-04 DIAGNOSIS — H34811 Central retinal vein occlusion, right eye, with macular edema: Secondary | ICD-10-CM | POA: Diagnosis not present

## 2021-05-06 ENCOUNTER — Other Ambulatory Visit: Payer: Self-pay | Admitting: Physician Assistant

## 2021-05-06 ENCOUNTER — Telehealth: Payer: Self-pay | Admitting: Cardiology

## 2021-05-06 MED ORDER — CLOPIDOGREL BISULFATE 75 MG PO TABS: 75.0000 mg | ORAL_TABLET | Freq: Every day | ORAL | 5 refills | Status: DC

## 2021-05-06 NOTE — Telephone Encounter (Signed)
Refill sent in per request.  

## 2021-05-06 NOTE — Telephone Encounter (Signed)
*  STAT* If patient is at the pharmacy, call can be transferred to refill team.   1. Which medications need to be refilled? (please list name of each medication and dose if known)  clopidogrel (PLAVIX) 75 MG tablet  2. Which pharmacy/location (including street and city if local pharmacy) is medication to be sent to? [id number] - [place], [place] - [address]  3. Do they need a 30 day or 90 day supply?  90 day supply   Pt states this medication was prescribed while he was hospitalized

## 2021-07-04 ENCOUNTER — Encounter (INDEPENDENT_AMBULATORY_CARE_PROVIDER_SITE_OTHER): Payer: 59 | Admitting: Ophthalmology

## 2021-07-04 ENCOUNTER — Other Ambulatory Visit: Payer: Self-pay

## 2021-07-04 DIAGNOSIS — H34811 Central retinal vein occlusion, right eye, with macular edema: Secondary | ICD-10-CM

## 2021-07-04 DIAGNOSIS — H43813 Vitreous degeneration, bilateral: Secondary | ICD-10-CM | POA: Diagnosis not present

## 2021-07-04 DIAGNOSIS — I10 Essential (primary) hypertension: Secondary | ICD-10-CM

## 2021-07-04 DIAGNOSIS — H35033 Hypertensive retinopathy, bilateral: Secondary | ICD-10-CM

## 2021-10-10 ENCOUNTER — Encounter (INDEPENDENT_AMBULATORY_CARE_PROVIDER_SITE_OTHER): Payer: 59 | Admitting: Ophthalmology

## 2021-10-10 ENCOUNTER — Other Ambulatory Visit: Payer: Self-pay

## 2021-10-10 DIAGNOSIS — I10 Essential (primary) hypertension: Secondary | ICD-10-CM

## 2021-10-10 DIAGNOSIS — H35033 Hypertensive retinopathy, bilateral: Secondary | ICD-10-CM | POA: Diagnosis not present

## 2021-10-10 DIAGNOSIS — H43813 Vitreous degeneration, bilateral: Secondary | ICD-10-CM | POA: Diagnosis not present

## 2021-10-10 DIAGNOSIS — H34811 Central retinal vein occlusion, right eye, with macular edema: Secondary | ICD-10-CM | POA: Diagnosis not present

## 2021-11-02 ENCOUNTER — Other Ambulatory Visit: Payer: Self-pay | Admitting: Cardiology

## 2021-11-04 ENCOUNTER — Telehealth: Payer: Self-pay | Admitting: Cardiology

## 2021-11-04 MED ORDER — CLOPIDOGREL BISULFATE 75 MG PO TABS: 75.0000 mg | ORAL_TABLET | Freq: Every day | ORAL | 0 refills | Status: DC

## 2021-11-04 NOTE — Telephone Encounter (Signed)
Refill of Plavix 75 mg sent to [facility] [address]. °

## 2021-11-04 NOTE — Telephone Encounter (Signed)
?*  STAT* If patient is at the pharmacy, call can be transferred to refill team. ? ? ?1. Which medications need to be refilled? (please list name of each medication and dose if known)  ? clopidogrel (PLAVIX) 75 MG tablet  ? ? ?2. Which pharmacy/location (including street and city if local pharmacy) is medication to be sent to? [id number] - [place], [place] - [address] AT [facility] [address] ? ?3. Do they need a 30 day or 90 day supply? 90  ?

## 2021-11-29 ENCOUNTER — Ambulatory Visit: Payer: 59 | Admitting: Cardiology

## 2021-12-21 LAB — LIPID PANEL
Chol/HDL Ratio: 3.7 ratio (ref 0.0–5.0)
Cholesterol, Total: 167 mg/dL (ref 100–199)
HDL: 45 mg/dL (ref >39)
LDL Chol Calc (NIH): 102 mg/dL — ABNORMAL HIGH (ref 0–99)
Triglycerides: 107 mg/dL (ref 0–149)
VLDL Cholesterol Cal: 20 mg/dL (ref 5–40)

## 2021-12-21 LAB — CBC WITH DIFFERENTIAL/PLATELET
Basophils Absolute: 0 10*3/uL (ref 0.0–0.2)
Basos: 1 %
EOS (ABSOLUTE): 0.2 10*3/uL (ref 0.0–0.4)
Eos: 2 %
Hematocrit: 41.3 % (ref 37.5–51.0)
Hemoglobin: 13.7 g/dL (ref 13.0–17.7)
Immature Grans (Abs): 0 10*3/uL (ref 0.0–0.1)
Immature Granulocytes: 0 %
Lymphocytes Absolute: 1.7 10*3/uL (ref 0.7–3.1)
Lymphs: 24 %
MCH: 30 pg (ref 26.6–33.0)
MCHC: 33.2 g/dL (ref 31.5–35.7)
MCV: 90 fL (ref 79–97)
Monocytes Absolute: 0.6 10*3/uL (ref 0.1–0.9)
Monocytes: 8 %
Neutrophils Absolute: 4.6 10*3/uL (ref 1.4–7.0)
Neutrophils: 65 %
Platelets: 266 10*3/uL (ref 150–450)
RBC: 4.57 x10E6/uL (ref 4.14–5.80)
RDW: 12.6 % (ref 11.6–15.4)
WBC: 7.1 10*3/uL (ref 3.4–10.8)

## 2021-12-21 LAB — HEPATIC FUNCTION PANEL
ALT: 29 IU/L (ref 0–44)
AST: 22 IU/L (ref 0–40)
Albumin: 4.7 g/dL (ref 3.8–4.9)
Alkaline Phosphatase: 65 IU/L (ref 44–121)
Bilirubin Total: 0.5 mg/dL (ref 0.0–1.2)
Bilirubin, Direct: 0.14 mg/dL (ref 0.00–0.40)
Total Protein: 6.9 g/dL (ref 6.0–8.5)

## 2021-12-21 LAB — TSH: TSH: 3.93 u[IU]/mL (ref 0.450–4.500)

## 2021-12-21 LAB — BASIC METABOLIC PANEL
BUN/Creatinine Ratio: 10 (ref 9–20)
BUN: 11 mg/dL (ref 6–24)
CO2: 24 mmol/L (ref 20–29)
Calcium: 9.9 mg/dL (ref 8.7–10.2)
Chloride: 101 mmol/L (ref 96–106)
Creatinine, Ser: 1.1 mg/dL (ref 0.76–1.27)
Glucose: 100 mg/dL — ABNORMAL HIGH (ref 70–99)
Potassium: 4.6 mmol/L (ref 3.5–5.2)
Sodium: 137 mmol/L (ref 134–144)
eGFR: 77 mL/min/{1.73_m2} (ref >59)

## 2021-12-24 MED ORDER — EZETIMIBE 10 MG PO TABS: 10.0000 mg | ORAL_TABLET | Freq: Every day | ORAL | 3 refills | Status: DC

## 2021-12-27 ENCOUNTER — Encounter: Payer: Self-pay | Admitting: Cardiology

## 2021-12-27 ENCOUNTER — Ambulatory Visit (INDEPENDENT_AMBULATORY_CARE_PROVIDER_SITE_OTHER): Payer: 59 | Admitting: Cardiology

## 2021-12-27 VITALS — BP 124/80 | HR 64 | Ht 67.0 in | Wt 218.8 lb

## 2021-12-27 DIAGNOSIS — I2510 Atherosclerotic heart disease of native coronary artery without angina pectoris: Secondary | ICD-10-CM | POA: Diagnosis not present

## 2021-12-27 DIAGNOSIS — Z951 Presence of aortocoronary bypass graft: Secondary | ICD-10-CM | POA: Diagnosis not present

## 2021-12-27 DIAGNOSIS — E782 Mixed hyperlipidemia: Secondary | ICD-10-CM

## 2021-12-27 DIAGNOSIS — E7800 Pure hypercholesterolemia, unspecified: Secondary | ICD-10-CM | POA: Diagnosis not present

## 2021-12-27 DIAGNOSIS — E669 Obesity, unspecified: Secondary | ICD-10-CM

## 2021-12-27 NOTE — Addendum Note (Signed)
Addended by: Edwyna Shell I on: 12/27/2021 04:22 PM ? ? Modules accepted: Orders ? ?

## 2021-12-27 NOTE — Patient Instructions (Addendum)
Medication Instructions:  ?Your physician has recommended you make the following change in your medication:  ? ?STOP: Plavix ? ?*If you need a refill on your cardiac medications before your next appointment, please call your pharmacy* ? ? ?Lab Work: ?None ?If you have labs (blood work) drawn today and your tests are completely normal, you will receive your results only by: ?MyChart Message (if you have MyChart) OR ?A paper copy in the mail ?If you have any lab test that is abnormal or we need to change your treatment, we will call you to review the results. ? ? ?Testing/Procedures: ?None ? ? ?Follow-Up: ?At [facility], you and your health needs are our priority.  As part of our continuing mission to provide you with exceptional heart care, we have created designated Provider Care Teams.  These Care Teams include your primary Cardiologist (physician) and Advanced Practice Providers (APPs -  Physician Assistants and Nurse Practitioners) who all work together to provide you with the care you need, when you need it. ? ?We recommend signing up for the patient portal called "MyChart".  Sign up information is provided on this After Visit Summary.  MyChart is used to connect with patients for Virtual Visits (Telemedicine).  Patients are able to view lab/test results, encounter notes, upcoming appointments, etc.  Non-urgent messages can be sent to your provider as well.   ?To learn more about what you can do with MyChart, go to [email or web address].   ? ?Your next appointment:   ?9 month(s) ? ?The format for your next appointment:   ?In Person ? ?Provider:   ?Belva Crome, MD  ? ? ?Other Instructions ?None ? ?Important Information About Sugar ? ? ? ? ? ? ?

## 2021-12-27 NOTE — Progress Notes (Signed)
?Cardiology Office Note:   ? ?Date:  12/27/2021  ? ?ID:  Jorge LasterJohn Mallin Jr., DOB 05/19/1961, MRN [id number] ? ?PCP:  Patient, No Pcp Per (Inactive)  ?Cardiologist:  Garwin Brothersajan R Edna Grover, MD  ? ?Referring MD: No ref. provider found  ? ? ?ASSESSMENT:   ? ?1. Mixed hyperlipidemia   ?2. Coronary artery disease involving native coronary artery of native heart without angina pectoris   ?3. Pure hypercholesterolemia   ?4. S/P CABG x 3   ?5. Obesity (BMI 35.0-39.9 without comorbidity)   ? ?PLAN:   ? ?In order of problems listed above: ? ?Coronary artery disease post CABG surgery: Secondary prevention stressed with the patient.  Importance of compliance with diet and medication stressed and he vocalized understanding.  He was advised to walk at least half an hour a day 5 days a week and he promises to do so. ?Essential hypertension: Blood pressure stable and diet was emphasized.  Lifestyle modification urged. ?Mixed dyslipidemia: Lipids were reviewed.  We have added Zetia.  I told the patient will be back in 6 weeks for liver lipid check.  Diet was emphasized and he promises to do better. ?Obesity: Weight reduction stressed.  Exercise stressed risks of obesity explained and he promises to do better. ?Patient will be seen in follow-up appointment in 6 months or earlier if the patient has any concerns ? ? ? ?Medication Adjustments/Labs and Tests Ordered: ?Current medicines are reviewed at length with the patient today.  Concerns regarding medicines are outlined above.  ?No orders of the defined types were placed in this encounter. ? ?No orders of the defined types were placed in this encounter. ? ? ? ?No chief complaint on file. ?  ? ?History of Present Illness:   ? ?Jorge LasterJohn Yeung Jr. is a 61 y.o. male.  Patient has past medical history of coronary artery disease postcoronary artery bypass surgery, essential hypertension and mixed dyslipidemia and obesity.  He denies any problems at this time and takes care of activities of daily  living.  No chest pain orthopnea or PND.  At the time of my evaluation, the patient is alert awake oriented and in no distress.  He tells me that he exercises on a regular basis. ? ?Past Medical History:  ?Diagnosis Date  ? BPH (benign prostatic hypertrophy) 03/16/2012  ? CAD (coronary artery disease) 11/26/2020  ? Central retinal vein occlusion of right eye 03/18/2013  ? [name]  ? Erectile dysfunction 09/21/2013  ? Essential hypertension, benign 08/04/2013  ? Fatigue 03/16/2012  ? Hyperlipidemia   ? Hypertension   ? Obesity (BMI 35.0-39.9 without comorbidity) 03/16/2012  ? Pure hypercholesterolemia 08/04/2013  ? S/P CABG x 3 11/09/2020  ? LIMA to LAD, SVG to OM, SVG to PDA, EVH via right thigh  ? STEMI involving right coronary artery (HCC) 11/07/2020  ? ? ?Past Surgical History:  ?Procedure Laterality Date  ? CORONARY ARTERY BYPASS GRAFT N/A 11/09/2020  ? Procedure: CORONARY ARTERY BYPASS GRAFTING (CABG) X THREE , USING LEFT INTERNAL MAMMARY ARTERY AND RIGHT LEG GREATER SAPHENOUS VEIN HARVESTED ENDOSCOPICALLY;  Surgeon: Purcell Nailswen, Clarence H, MD;  [facility];  Service: Open Heart Surgery;  Laterality: N/A;  ? CORONARY/GRAFT ACUTE MI REVASCULARIZATION N/A 11/07/2020  ? Procedure: Coronary/Graft Acute MI Revascularization;  Surgeon: Tonny Bollmanooper, Michael, MD;  [facility];  Service: Cardiovascular;  Laterality: N/A;  ? LEFT HEART CATH AND CORONARY ANGIOGRAPHY N/A 11/07/2020  ? Procedure: LEFT HEART CATH AND CORONARY ANGIOGRAPHY;  Surgeon: Tonny Bollmanooper, Michael, MD;  Location: [facility] INVASIVE CV LAB;  Service: Cardiovascular;  Laterality: N/A;  ? TEE WITHOUT CARDIOVERSION N/A 11/09/2020  ? Procedure: TRANSESOPHAGEAL ECHOCARDIOGRAM (TEE);  Surgeon: Purcell Nails, MD;  Location: [facility] OR;  Service: Open Heart Surgery;  Laterality: N/A;  ? TONSILLECTOMY    ? ? ?Current Medications: ?Current Meds  ?Medication Sig  ? aspirin EC 81 MG tablet Take 81 mg by mouth daily. Swallow whole.  ? atorvastatin (LIPITOR) 80 MG tablet Take 1  tablet (80 mg total) by mouth daily.  ? clopidogrel (PLAVIX) 75 MG tablet Take 1 tablet (75 mg total) by mouth daily.  ? ezetimibe (ZETIA) 10 MG tablet Take 1 tablet (10 mg total) by mouth daily.  ? Melatonin 10 MG TABS Take 10 mg by mouth at bedtime as needed for sleep (sleep).  ? metoprolol tartrate (LOPRESSOR) 50 MG tablet Take 1 tablet (50 mg total) by mouth 2 (two) times daily.  ? nitroGLYCERIN (NITROSTAT) 0.4 MG SL tablet Place 0.4 mg under the tongue every 5 (five) minutes as needed for chest pain.  ? traMADol (ULTRAM) 50 MG tablet Take 50-100 mg by mouth as needed for moderate pain.  ? VITAMIN D PO Take 1 capsule by mouth daily.  ?  ? ?Allergies:   Patient has no known allergies.  ? ?Social History  ? ?Socioeconomic History  ? Marital status: Single  ?  Spouse name: Not on file  ? Number of children: Not on file  ? Years of education: Not on file  ? Highest education level: Not on file  ?Occupational History  ? Not on file  ?Tobacco Use  ? Smoking status: Former  ?  Types: Cigars  ?  Quit date: 08/18/2000  ?  Years since quitting: 21.3  ? Smokeless tobacco: Never  ? Tobacco comments:  ?  smoked on/off  ?Substance and Sexual Activity  ? Alcohol use: Yes  ?  Alcohol/week: 10.0 standard drinks  ?  Types: 10 Glasses of wine per week  ?  Comment: beer and wine  ? Drug use: No  ? Sexual activity: Yes  ?Other Topics Concern  ? Not on file  ?Social History Narrative  ? Marital status: single; dating.  ?    Children: none  ?    Employment:  Insurance account manager  ?     Tobacco: none  ?     Alcohol:  1-2 beers and wine per day.  No DWIs.  ?    Drugs: none  ?     Exercise: none  ?    Seatbelt:  100%  ?    Guns:  Loaded guns.    ?    Sexual activity: sexually active; no STDs; last STD screening 07/2013.  Total sexual partners = 40s.    75% of time uses condoms.  Females.  ?       ? ?Social Determinants of Health  ? ?Financial Resource Strain: Not on file  ?Food Insecurity: Not on file   ?Transportation Needs: Not on file  ?Physical Activity: Not on file  ?Stress: Not on file  ?Social Connections: Not on file  ?  ? ?Family History: ?The patient's family history includes Asthma in his sister; Cancer in his paternal grandfather and paternal grandmother; Heart disease in his maternal grandmother; Heart disease (age of onset: 34) in his father; Hyperlipidemia in his father; Hypertension in his father, maternal grandmother, mother, and paternal uncle; Mental illness in his father; Stroke in his father,  maternal grandmother, and paternal uncle. ? ?ROS:   ?Please see the history of present illness.    ?All other systems reviewed and are negative. ? ?EKGs/Labs/Other Studies Reviewed:   ? ?The following studies were reviewed today: ?I discussed my findings with the patient at length ? ? ?Recent Labs: ?12/20/2021: ALT 29; BUN 11; Creatinine, Ser 1.10; Hemoglobin 13.7; Platelets 266; Potassium 4.6; Sodium 137; TSH 3.930  ?Recent Lipid Panel ?   ?Component Value Date/Time  ? CHOL 167 12/20/2021 0909  ? TRIG 107 12/20/2021 0909  ? HDL 45 12/20/2021 0909  ? CHOLHDL 3.7 12/20/2021 0909  ? CHOLHDL 5.7 11/08/2020 0139  ? VLDL 25 11/08/2020 0139  ? LDLCALC 102 (H) 12/20/2021 6720  ? ? ?Physical Exam:   ? ?VS:  BP 124/80 (BP Location: Right Arm, Patient Position: Sitting)   Pulse 64   Ht 5\' 7"  (1.702 m)   Wt 218 lb 12.8 oz (99.2 kg)   SpO2 97%   BMI 34.27 kg/m?    ? ?Wt Readings from Last 3 Encounters:  ?12/27/21 218 lb 12.8 oz (99.2 kg)  ?03/04/21 220 lb 12.8 oz (100.2 kg)  ?12/11/20 217 lb (98.4 kg)  ?  ? ?GEN: Patient is in no acute distress ?HEENT: Normal ?NECK: No JVD; No carotid bruits ?LYMPHATICS: No lymphadenopathy ?CARDIAC: Hear sounds regular, 2/6 systolic murmur at the apex. ?RESPIRATORY:  Clear to auscultation without rales, wheezing or rhonchi  ?ABDOMEN: Soft, non-tender, non-distended ?MUSCULOSKELETAL:  No edema; No deformity  ?SKIN: Warm and dry ?NEUROLOGIC:  Alert and oriented x 3 ?PSYCHIATRIC:   Normal affect  ? ?Signed, ?12/13/20, MD  ?12/27/2021 4:00 PM    ?[facility]  ?

## 2022-01-16 ENCOUNTER — Encounter (INDEPENDENT_AMBULATORY_CARE_PROVIDER_SITE_OTHER): Payer: 59 | Admitting: Ophthalmology

## 2022-01-16 DIAGNOSIS — H35033 Hypertensive retinopathy, bilateral: Secondary | ICD-10-CM

## 2022-01-16 DIAGNOSIS — H43813 Vitreous degeneration, bilateral: Secondary | ICD-10-CM | POA: Diagnosis not present

## 2022-01-16 DIAGNOSIS — H34811 Central retinal vein occlusion, right eye, with macular edema: Secondary | ICD-10-CM

## 2022-01-16 DIAGNOSIS — I10 Essential (primary) hypertension: Secondary | ICD-10-CM | POA: Diagnosis not present

## 2022-01-31 ENCOUNTER — Other Ambulatory Visit: Payer: Self-pay | Admitting: Cardiology

## 2022-02-03 ENCOUNTER — Other Ambulatory Visit: Payer: Self-pay

## 2022-02-03 MED ORDER — METOPROLOL TARTRATE 50 MG PO TABS
50.0000 mg | ORAL_TABLET | Freq: Two times a day (BID) | ORAL | 2 refills | Status: DC
Start: 2022-02-03 — End: 2022-10-28

## 2022-02-03 MED ORDER — ATORVASTATIN CALCIUM 80 MG PO TABS: 80.0000 mg | ORAL_TABLET | Freq: Every day | ORAL | 2 refills | Status: DC

## 2022-02-06 LAB — LIPID PANEL
Chol/HDL Ratio: 3.3 ratio (ref 0.0–5.0)
Cholesterol, Total: 127 mg/dL (ref 100–199)
HDL: 39 mg/dL — ABNORMAL LOW (ref >39)
LDL Chol Calc (NIH): 68 mg/dL (ref 0–99)
Triglycerides: 107 mg/dL (ref 0–149)
VLDL Cholesterol Cal: 20 mg/dL (ref 5–40)

## 2022-02-06 LAB — HEPATIC FUNCTION PANEL
ALT: 20 IU/L (ref 0–44)
AST: 19 IU/L (ref 0–40)
Albumin: 4.3 g/dL (ref 3.8–4.9)
Alkaline Phosphatase: 66 IU/L (ref 44–121)
Bilirubin Total: 0.6 mg/dL (ref 0.0–1.2)
Bilirubin, Direct: 0.17 mg/dL (ref 0.00–0.40)
Total Protein: 6.7 g/dL (ref 6.0–8.5)

## 2022-04-24 ENCOUNTER — Encounter (INDEPENDENT_AMBULATORY_CARE_PROVIDER_SITE_OTHER): Payer: 59 | Admitting: Ophthalmology

## 2022-04-24 DIAGNOSIS — H34811 Central retinal vein occlusion, right eye, with macular edema: Secondary | ICD-10-CM | POA: Diagnosis not present

## 2022-04-24 DIAGNOSIS — H35033 Hypertensive retinopathy, bilateral: Secondary | ICD-10-CM | POA: Diagnosis not present

## 2022-04-24 DIAGNOSIS — H43813 Vitreous degeneration, bilateral: Secondary | ICD-10-CM

## 2022-04-24 DIAGNOSIS — I10 Essential (primary) hypertension: Secondary | ICD-10-CM | POA: Diagnosis not present

## 2022-07-31 ENCOUNTER — Encounter (INDEPENDENT_AMBULATORY_CARE_PROVIDER_SITE_OTHER): Payer: 59 | Admitting: Ophthalmology

## 2022-07-31 DIAGNOSIS — H34811 Central retinal vein occlusion, right eye, with macular edema: Secondary | ICD-10-CM

## 2022-07-31 DIAGNOSIS — H35033 Hypertensive retinopathy, bilateral: Secondary | ICD-10-CM | POA: Diagnosis not present

## 2022-07-31 DIAGNOSIS — H43813 Vitreous degeneration, bilateral: Secondary | ICD-10-CM | POA: Diagnosis not present

## 2022-07-31 DIAGNOSIS — I10 Essential (primary) hypertension: Secondary | ICD-10-CM

## 2022-10-03 ENCOUNTER — Encounter: Payer: Self-pay | Admitting: Cardiology

## 2022-10-03 ENCOUNTER — Ambulatory Visit: Payer: 59 | Attending: Cardiology | Admitting: Cardiology

## 2022-10-03 VITALS — BP 122/72 | HR 60 | Ht 67.0 in | Wt 232.4 lb

## 2022-10-03 DIAGNOSIS — I2510 Atherosclerotic heart disease of native coronary artery without angina pectoris: Secondary | ICD-10-CM | POA: Diagnosis not present

## 2022-10-03 DIAGNOSIS — Z951 Presence of aortocoronary bypass graft: Secondary | ICD-10-CM | POA: Diagnosis not present

## 2022-10-03 DIAGNOSIS — E7800 Pure hypercholesterolemia, unspecified: Secondary | ICD-10-CM

## 2022-10-03 DIAGNOSIS — I10 Essential (primary) hypertension: Secondary | ICD-10-CM

## 2022-10-03 DIAGNOSIS — E669 Obesity, unspecified: Secondary | ICD-10-CM

## 2022-10-03 MED ORDER — NITROGLYCERIN 0.4 MG SL SUBL: 0.4000 mg | SUBLINGUAL_TABLET | SUBLINGUAL | 11 refills | Status: AC | PRN

## 2022-10-03 NOTE — Progress Notes (Signed)
Cardiology Office Note:    Date:  10/03/2022   ID:  Jorge Fantasia., DOB 12-Aug-1961, MRN [id number]  PCP:  Patient, No Pcp Per  Cardiologist:  Jenean Lindau, MD   Referring MD: No ref. provider found    ASSESSMENT:    1. Essential hypertension, benign   2. Coronary artery disease involving native coronary artery of native heart without angina pectoris   3. S/P CABG x 3   4. Pure hypercholesterolemia   5. Obesity (BMI 35.0-39.9 without comorbidity)    PLAN:    In order of problems listed above:  Coronary artery disease post CABG surgery: Secondary prevention stressed with the patient.  Importance of compliance with diet medication stressed and vocalized understanding.  He was advised to walk at least half an hour a day 5 days a week and he promises to do so. Essential hypertension: Blood pressure is stable and diet was emphasized.  Lifestyle modification urged. Mixed dyslipidemia: On lipid-lowering medications.  Lipids reviewed he is fasting and will have complete blood work today.  Will do hemoglobin A1c and he requests vitamin D because of history of deficiency and we will check this for him. Obesity: Weight reduction stressed.  Diet emphasized.  Risks of obesity explained and he promises to do better. Patient will be seen in follow-up appointment in 6 months or earlier if the patient has any concerns    Medication Adjustments/Labs and Tests Ordered: Current medicines are reviewed at length with the patient today.  Concerns regarding medicines are outlined above.  No orders of the defined types were placed in this encounter.  No orders of the defined types were placed in this encounter.    No chief complaint on file.    History of Present Illness:    Jorge Arritt. is a 62 y.o. male.  Patient has past medical history of coronary artery disease post CABG surgery, mixed dyslipidemia, essential hypertension and morbid obesity.  He denies any problems at this time and  takes care of activities of daily living.  No chest pain orthopnea or PND.  He leads a sedentary lifestyle.  At the time of my evaluation, the patient is alert awake oriented and in no distress.  Past Medical History:  Diagnosis Date   BPH (benign prostatic hypertrophy) 03/16/2012   CAD (coronary artery disease) 11/26/2020   Central retinal vein occlusion of right eye 03/18/2013   Zigmund Daniel   Erectile dysfunction 09/21/2013   Essential hypertension, benign 08/04/2013   Fatigue 03/16/2012   Hyperlipidemia    Hypertension    Obesity (BMI 35.0-39.9 without comorbidity) 03/16/2012   Pure hypercholesterolemia 08/04/2013   S/P CABG x 3 11/09/2020   LIMA to LAD, SVG to OM, SVG to PDA, [facility] via right thigh   STEMI involving right coronary artery ([place]) 11/07/2020    Past Surgical History:  Procedure Laterality Date   CORONARY ARTERY BYPASS GRAFT N/A 11/09/2020   Procedure: CORONARY ARTERY BYPASS GRAFTING (CABG) X THREE , USING LEFT INTERNAL MAMMARY ARTERY AND RIGHT LEG GREATER SAPHENOUS VEIN HARVESTED ENDOSCOPICALLY;  Surgeon: Rexene Alberts, MD;  Location: [place];  Service: Open Heart Surgery;  Laterality: N/A;   CORONARY/GRAFT ACUTE MI REVASCULARIZATION N/A 11/07/2020   Procedure: Coronary/Graft Acute MI Revascularization;  Surgeon: Sherren Mocha, MD;  Location: [place] CV LAB;  Service: Cardiovascular;  Laterality: N/A;   LEFT HEART CATH AND CORONARY ANGIOGRAPHY N/A 11/07/2020   Procedure: LEFT HEART CATH AND CORONARY ANGIOGRAPHY;  Surgeon: Sherren Mocha, MD;  Location:  [place] INVASIVE CV LAB;  Service: Cardiovascular;  Laterality: N/A;   TEE WITHOUT CARDIOVERSION N/A 11/09/2020   Procedure: TRANSESOPHAGEAL ECHOCARDIOGRAM (TEE);  Surgeon: Rexene Alberts, MD;  Location: [place];  Service: Open Heart Surgery;  Laterality: N/A;   TONSILLECTOMY      Current Medications: Current Meds  Medication Sig   aspirin EC 81 MG tablet Take 81 mg by mouth daily. Swallow whole.   atorvastatin (LIPITOR) 80 MG  tablet Take 1 tablet (80 mg total) by mouth daily.   ezetimibe (ZETIA) 10 MG tablet Take 1 tablet (10 mg total) by mouth daily.   Melatonin 10 MG TABS Take 10 mg by mouth at bedtime as needed for sleep (sleep).   metoprolol tartrate (LOPRESSOR) 50 MG tablet Take 1 tablet (50 mg total) by mouth 2 (two) times daily.   nitroGLYCERIN (NITROSTAT) 0.4 MG SL tablet Place 0.4 mg under the tongue every 5 (five) minutes as needed for chest pain.     Allergies:   Patient has no known allergies.   Social History   Socioeconomic History   Marital status: Single    Spouse name: Not on file   Number of children: Not on file   Years of education: Not on file   Highest education level: Not on file  Occupational History   Not on file  Tobacco Use   Smoking status: Former    Types: Cigars    Quit date: 08/18/2000    Years since quitting: 22.1   Smokeless tobacco: Never   Tobacco comments:    smoked on/off  Substance and Sexual Activity   Alcohol use: Yes    Alcohol/week: 10.0 standard drinks of alcohol    Types: 10 Glasses of wine per week    Comment: beer and wine   Drug use: No   Sexual activity: Yes  Other Topics Concern   Not on file  Social History Narrative   Marital status: single; dating.      Children: none      Employment:  Theatre stage manager       Tobacco: none       Alcohol:  1-2 beers and wine per day.  No DWIs.      Drugs: none       Exercise: none      Seatbelt:  100%      Guns:  Loaded guns.        Sexual activity: sexually active; no STDs; last STD screening 07/2013.  Total sexual partners = 40s.    75% of time uses condoms.  Females.          Social Determinants of Health   Financial Resource Strain: Not on file  Food Insecurity: Not on file  Transportation Needs: Not on file  Physical Activity: Not on file  Stress: Not on file  Social Connections: Not on file     Family History: The patient's family history includes Asthma in his  sister; Cancer in his paternal grandfather and paternal grandmother; Heart disease in his maternal grandmother; Heart disease (age of onset: 83) in his father; Hyperlipidemia in his father; Hypertension in his father, maternal grandmother, mother, and paternal uncle; Mental illness in his father; Stroke in his father, maternal grandmother, and paternal uncle.  ROS:   Please see the history of present illness.    All other systems reviewed and are negative.  EKGs/Labs/Other Studies Reviewed:    The following studies were reviewed today: I discussed my findings  with the patient at length.  EKG reveals sinus rhythm and nonspecific ST-T changes.   Recent Labs: 12/20/2021: BUN 11; Creatinine, Ser 1.10; Hemoglobin 13.7; Platelets 266; Potassium 4.6; Sodium 137; TSH 3.930 02/05/2022: ALT 20  Recent Lipid Panel    Component Value Date/Time   CHOL 127 02/05/2022 0901   TRIG 107 02/05/2022 0901   HDL 39 (L) 02/05/2022 0901   CHOLHDL 3.3 02/05/2022 0901   CHOLHDL 5.7 11/08/2020 0139   VLDL 25 11/08/2020 0139   LDLCALC 68 02/05/2022 0901    Physical Exam:    VS:  BP 122/72   Pulse 60   Ht 5' 7"$  (1.702 m)   Wt 232 lb 6.4 oz (105.4 kg)   SpO2 96%   BMI 36.40 kg/m     Wt Readings from Last 3 Encounters:  10/03/22 232 lb 6.4 oz (105.4 kg)  12/27/21 218 lb 12.8 oz (99.2 kg)  03/04/21 220 lb 12.8 oz (100.2 kg)     GEN: Patient is in no acute distress HEENT: Normal NECK: No JVD; No carotid bruits LYMPHATICS: No lymphadenopathy CARDIAC: Hear sounds regular, 2/6 systolic murmur at the apex. RESPIRATORY:  Clear to auscultation without rales, wheezing or rhonchi  ABDOMEN: Soft, non-tender, non-distended MUSCULOSKELETAL:  No edema; No deformity  SKIN: Warm and dry NEUROLOGIC:  Alert and oriented x 3 PSYCHIATRIC:  Normal affect   Signed, Jenean Lindau, MD  10/03/2022 8:31 AM    [place]

## 2022-10-03 NOTE — Patient Instructions (Signed)
Medication Instructions:  Your physician recommends that you continue on your current medications as directed. Please refer to the Current Medication list given to you today.  *If you need a refill on your cardiac medications before your next appointment, please call your pharmacy*   Lab Work: Your physician recommends that you return for lab work in: Today for a BMP, CBC, TSH, Liver Function, Lipid Panel, A1C and Vit D  If you have labs (blood work) drawn today and your tests are completely normal, you will receive your results only by: MyChart Message (if you have MyChart) OR A paper copy in the mail If you have any lab test that is abnormal or we need to change your treatment, we will call you to review the results.   Testing/Procedures: NONE   Follow-Up: At [facility], you and your health needs are our priority.  As part of our continuing mission to provide you with exceptional heart care, we have created designated Provider Care Teams.  These Care Teams include your primary Cardiologist (physician) and Advanced Practice Providers (APPs -  Physician Assistants and Nurse Practitioners) who all work together to provide you with the care you need, when you need it.  We recommend signing up for the patient portal called "MyChart".  Sign up information is provided on this After Visit Summary.  MyChart is used to connect with patients for Virtual Visits (Telemedicine).  Patients are able to view lab/test results, encounter notes, upcoming appointments, etc.  Non-urgent messages can be sent to your provider as well.   To learn more about what you can do with MyChart, go to [email or web address].    Your next appointment:   9 month(s)  Provider:   Jyl Heinz, MD    Other Instructions

## 2022-10-04 LAB — CBC WITH DIFFERENTIAL/PLATELET
Basophils Absolute: 0 10*3/uL (ref 0.0–0.2)
Basos: 1 %
EOS (ABSOLUTE): 0.2 10*3/uL (ref 0.0–0.4)
Eos: 2 %
Hematocrit: 39.9 % (ref 37.5–51.0)
Hemoglobin: 13.8 g/dL (ref 13.0–17.7)
Immature Grans (Abs): 0 10*3/uL (ref 0.0–0.1)
Immature Granulocytes: 0 %
Lymphocytes Absolute: 1.9 10*3/uL (ref 0.7–3.1)
Lymphs: 29 %
MCH: 31.1 pg (ref 26.6–33.0)
MCHC: 34.6 g/dL (ref 31.5–35.7)
MCV: 90 fL (ref 79–97)
Monocytes Absolute: 0.7 10*3/uL (ref 0.1–0.9)
Monocytes: 11 %
Neutrophils Absolute: 3.8 10*3/uL (ref 1.4–7.0)
Neutrophils: 57 %
Platelets: 265 10*3/uL (ref 150–450)
RBC: 4.44 x10E6/uL (ref 4.14–5.80)
RDW: 12.5 % (ref 11.6–15.4)
WBC: 6.6 10*3/uL (ref 3.4–10.8)

## 2022-10-04 LAB — LIPID PANEL
Chol/HDL Ratio: 2.7 ratio (ref 0.0–5.0)
Cholesterol, Total: 127 mg/dL (ref 100–199)
HDL: 47 mg/dL (ref >39)
LDL Chol Calc (NIH): 62 mg/dL (ref 0–99)
Triglycerides: 94 mg/dL (ref 0–149)
VLDL Cholesterol Cal: 18 mg/dL (ref 5–40)

## 2022-10-04 LAB — VITAMIN D 25 HYDROXY (VIT D DEFICIENCY, FRACTURES): Vit D, 25-Hydroxy: 51.1 ng/mL (ref 30.0–100.0)

## 2022-10-04 LAB — BASIC METABOLIC PANEL
BUN/Creatinine Ratio: 11 (ref 10–24)
BUN: 12 mg/dL (ref 8–27)
CO2: 23 mmol/L (ref 20–29)
Calcium: 9.9 mg/dL (ref 8.6–10.2)
Chloride: 103 mmol/L (ref 96–106)
Creatinine, Ser: 1.07 mg/dL (ref 0.76–1.27)
Glucose: 100 mg/dL — ABNORMAL HIGH (ref 70–99)
Potassium: 4.7 mmol/L (ref 3.5–5.2)
Sodium: 140 mmol/L (ref 134–144)
eGFR: 79 mL/min/{1.73_m2} (ref >59)

## 2022-10-04 LAB — HEMOGLOBIN A1C
Est. average glucose Bld gHb Est-mCnc: 117 mg/dL
Hgb A1c MFr Bld: 5.7 % — ABNORMAL HIGH (ref 4.8–5.6)

## 2022-10-04 LAB — HEPATIC FUNCTION PANEL
ALT: 46 IU/L — ABNORMAL HIGH (ref 0–44)
AST: 37 IU/L (ref 0–40)
Albumin: 4.5 g/dL (ref 3.8–4.9)
Alkaline Phosphatase: 62 IU/L (ref 44–121)
Bilirubin Total: 0.5 mg/dL (ref 0.0–1.2)
Bilirubin, Direct: 0.15 mg/dL (ref 0.00–0.40)
Total Protein: 6.7 g/dL (ref 6.0–8.5)

## 2022-10-04 LAB — TSH: TSH: 3.19 u[IU]/mL (ref 0.450–4.500)

## 2022-10-09 ENCOUNTER — Telehealth: Payer: Self-pay

## 2022-10-09 DIAGNOSIS — R7989 Other specified abnormal findings of blood chemistry: Secondary | ICD-10-CM

## 2022-10-09 NOTE — Telephone Encounter (Signed)
-----   Message from Jenean Lindau, MD sent at 10/06/2022  9:28 AM EST ----- Overall labs are normal.  Lipids are fine but liver tests are mildly elevated.  Advised patient not to take any over-the-counter medications.  No Tylenol.  Recheck in 1 month only liver panel and copy primary care patient needs to talk to primary care about elevated LFTs also. Jenean Lindau, MD 10/06/2022 9:27 AM

## 2022-10-28 ENCOUNTER — Other Ambulatory Visit: Payer: Self-pay | Admitting: Cardiology

## 2022-11-01 LAB — HEPATIC FUNCTION PANEL
ALT: 28 IU/L (ref 0–44)
AST: 22 IU/L (ref 0–40)
Albumin: 4.6 g/dL (ref 3.8–4.9)
Alkaline Phosphatase: 80 IU/L (ref 44–121)
Bilirubin Total: 0.9 mg/dL (ref 0.0–1.2)
Bilirubin, Direct: 0.21 mg/dL (ref 0.00–0.40)
Total Protein: 6.7 g/dL (ref 6.0–8.5)

## 2022-11-06 ENCOUNTER — Encounter (INDEPENDENT_AMBULATORY_CARE_PROVIDER_SITE_OTHER): Payer: 59 | Admitting: Ophthalmology

## 2022-11-06 DIAGNOSIS — H34811 Central retinal vein occlusion, right eye, with macular edema: Secondary | ICD-10-CM | POA: Diagnosis not present

## 2022-11-06 DIAGNOSIS — I10 Essential (primary) hypertension: Secondary | ICD-10-CM

## 2022-11-06 DIAGNOSIS — H43813 Vitreous degeneration, bilateral: Secondary | ICD-10-CM | POA: Diagnosis not present

## 2022-11-06 DIAGNOSIS — H35033 Hypertensive retinopathy, bilateral: Secondary | ICD-10-CM

## 2022-11-28 IMAGING — DX DG CHEST 2V
2 series · 2 of 2 positions shown · non-contrast
Comparison: 11/13/2020.

CLINICAL DATA: CABG.

EXAM:
CHEST - 2 VIEW

[dg chest 2 view (1 of 2)]
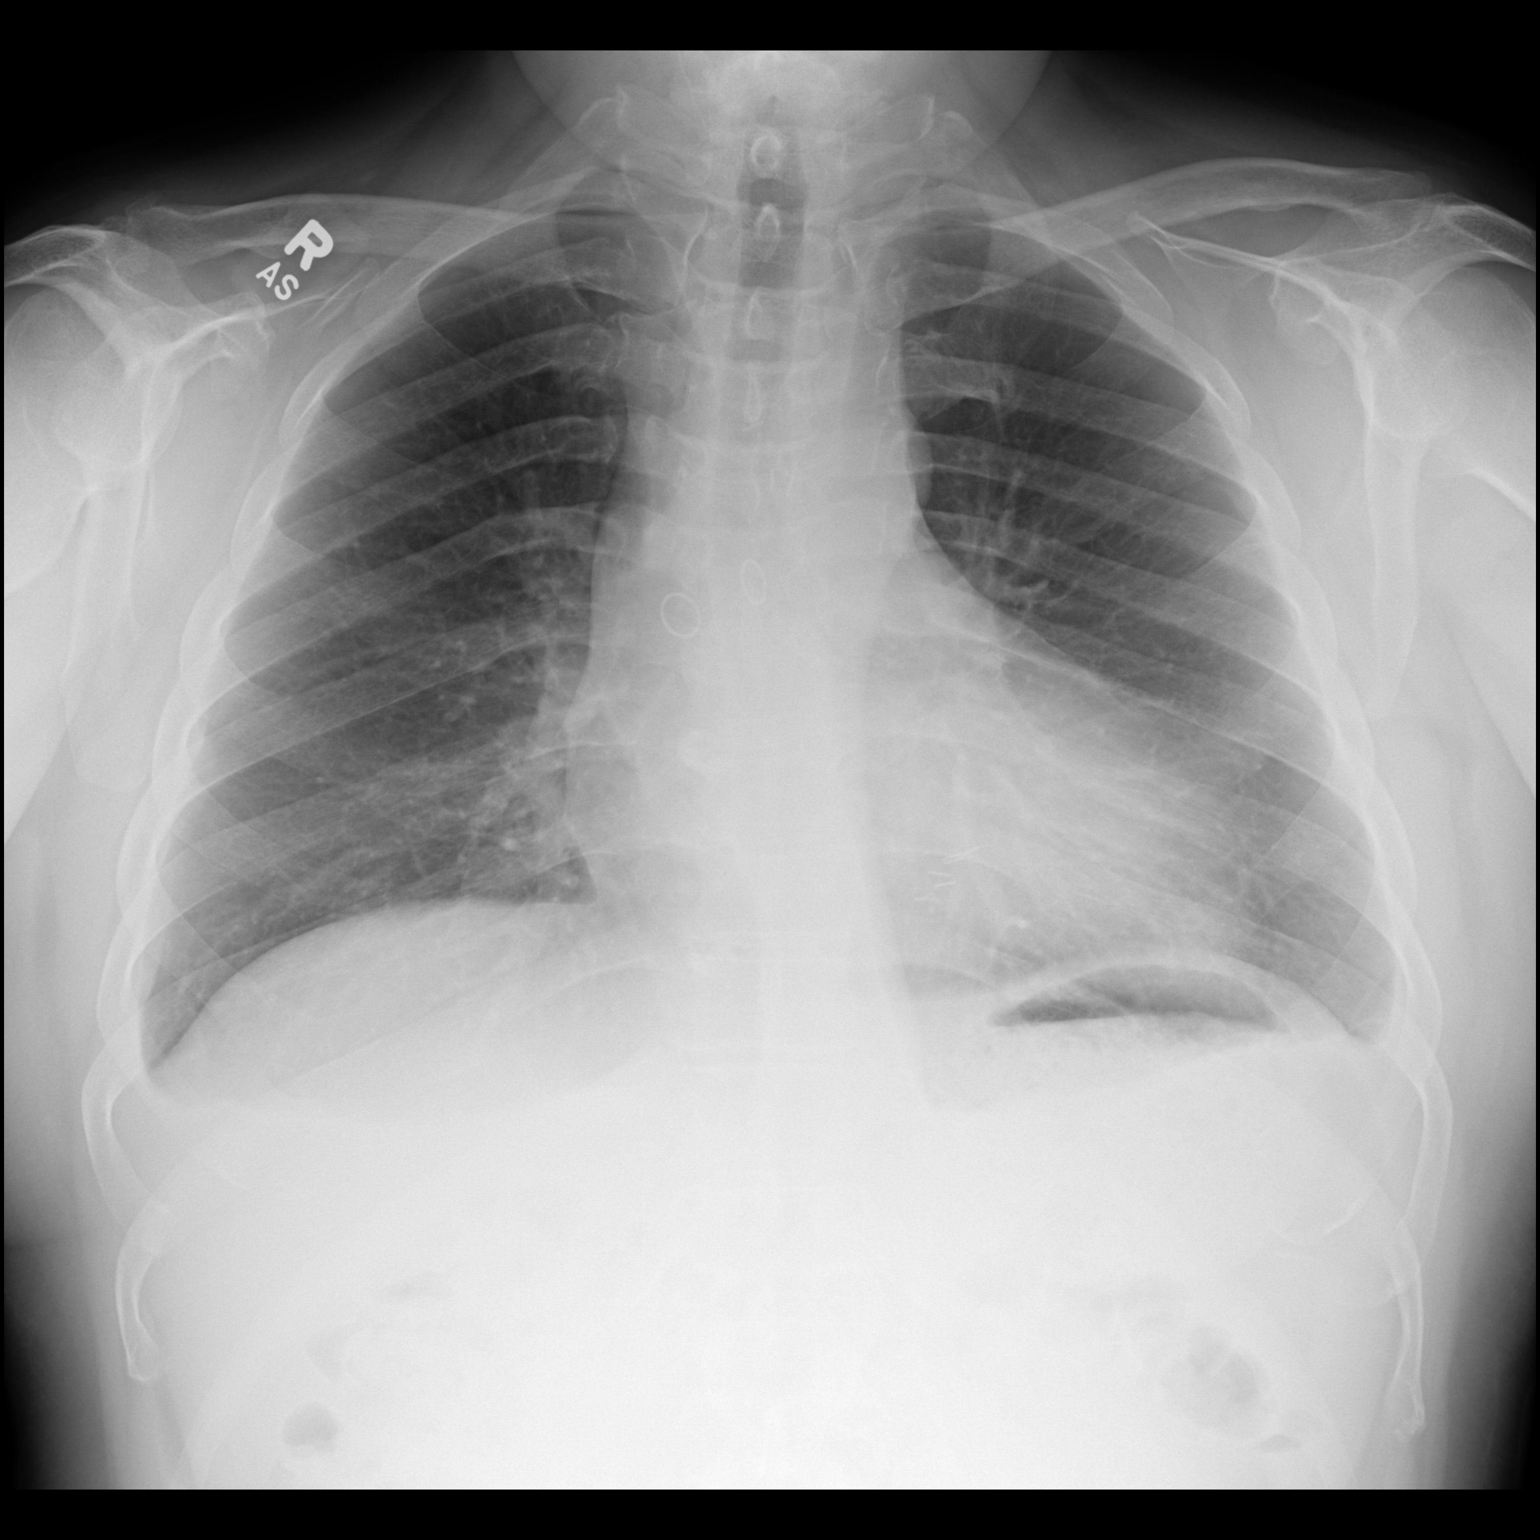

[dg chest 2 view (2 of 2)]
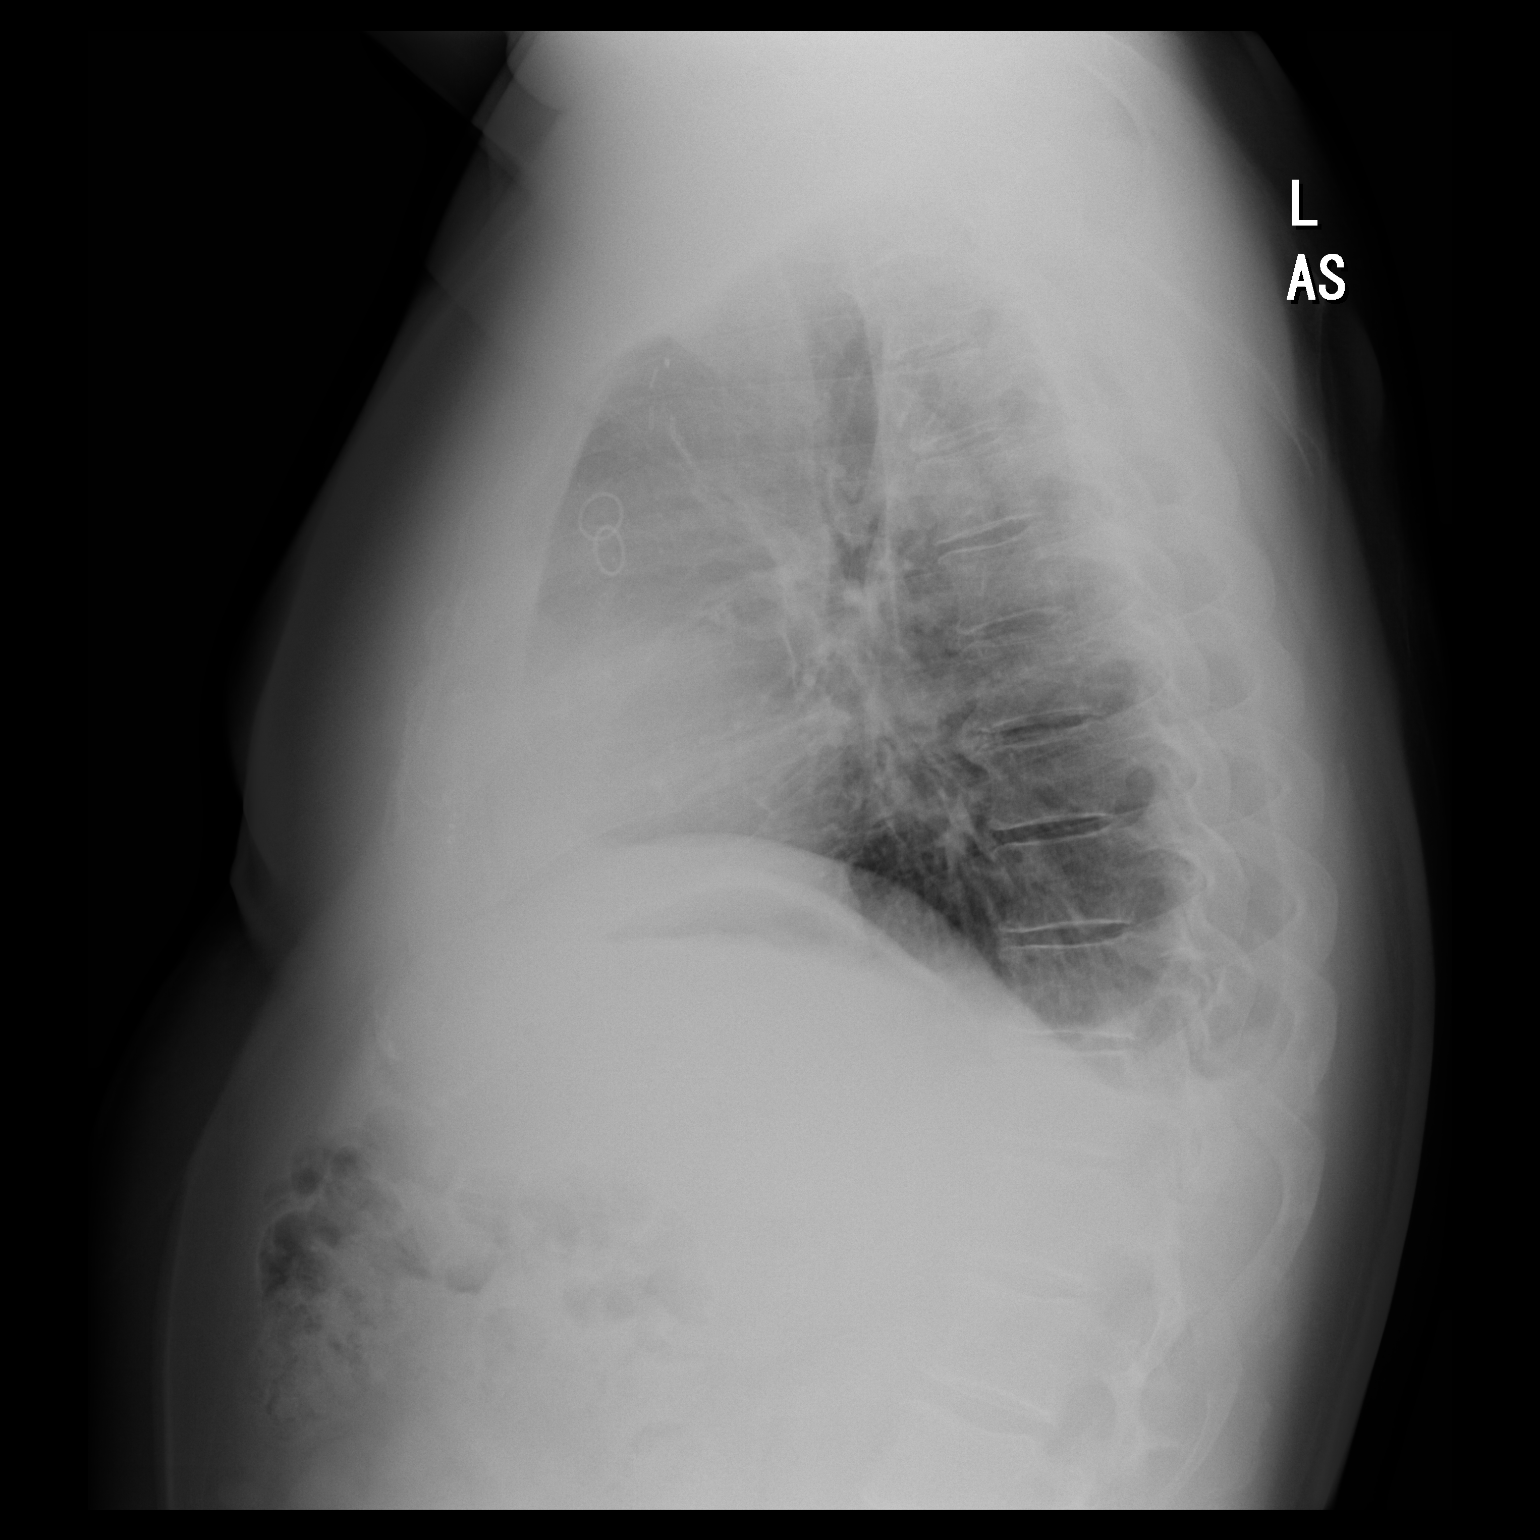

[2 of 2 positions shown; findings below may reference images not displayed]

FINDINGS: Prior CABG. Cardiomegaly. No pulmonary venous congestion. Low lung
volumes with mild bibasilar and lingular atelectasis. Findings have
improved from prior exam. Tiny bilateral pleural effusions. No
pneumothorax.
IMPRESSION: 1.  Prior CABG.  Cardiomegaly.  No pulmonary venous congestion.

2. Low lung volumes with mild bibasilar linear atelectasis, improved
from prior exam. Tiny bilateral pleural effusions noted.

## 2022-12-01 ENCOUNTER — Other Ambulatory Visit: Payer: Self-pay | Admitting: Cardiology

## 2023-02-12 ENCOUNTER — Encounter (INDEPENDENT_AMBULATORY_CARE_PROVIDER_SITE_OTHER): Payer: 59 | Admitting: Ophthalmology

## 2023-02-12 DIAGNOSIS — H2513 Age-related nuclear cataract, bilateral: Secondary | ICD-10-CM

## 2023-02-12 DIAGNOSIS — H34811 Central retinal vein occlusion, right eye, with macular edema: Secondary | ICD-10-CM | POA: Diagnosis not present

## 2023-02-12 DIAGNOSIS — H35033 Hypertensive retinopathy, bilateral: Secondary | ICD-10-CM | POA: Diagnosis not present

## 2023-02-12 DIAGNOSIS — H43813 Vitreous degeneration, bilateral: Secondary | ICD-10-CM

## 2023-02-12 DIAGNOSIS — I10 Essential (primary) hypertension: Secondary | ICD-10-CM

## 2023-05-21 ENCOUNTER — Encounter (INDEPENDENT_AMBULATORY_CARE_PROVIDER_SITE_OTHER): Payer: 59 | Admitting: Ophthalmology

## 2023-05-28 ENCOUNTER — Encounter (INDEPENDENT_AMBULATORY_CARE_PROVIDER_SITE_OTHER): Payer: 59 | Admitting: Ophthalmology

## 2023-05-28 DIAGNOSIS — H43813 Vitreous degeneration, bilateral: Secondary | ICD-10-CM

## 2023-05-28 DIAGNOSIS — H35033 Hypertensive retinopathy, bilateral: Secondary | ICD-10-CM | POA: Diagnosis not present

## 2023-05-28 DIAGNOSIS — I10 Essential (primary) hypertension: Secondary | ICD-10-CM

## 2023-05-28 DIAGNOSIS — H34811 Central retinal vein occlusion, right eye, with macular edema: Secondary | ICD-10-CM | POA: Diagnosis not present

## 2023-05-28 DIAGNOSIS — H2513 Age-related nuclear cataract, bilateral: Secondary | ICD-10-CM

## 2023-07-25 ENCOUNTER — Other Ambulatory Visit: Payer: Self-pay | Admitting: Cardiology

## 2023-07-27 ENCOUNTER — Other Ambulatory Visit: Payer: Self-pay | Admitting: Cardiology

## 2023-08-20 ENCOUNTER — Ambulatory Visit: Payer: BLUE CROSS/BLUE SHIELD | Attending: Cardiology | Admitting: Cardiology

## 2023-08-20 ENCOUNTER — Encounter: Payer: Self-pay | Admitting: Cardiology

## 2023-08-20 VITALS — BP 132/88 | HR 62 | Ht 67.0 in | Wt 242.0 lb

## 2023-08-20 DIAGNOSIS — E7800 Pure hypercholesterolemia, unspecified: Secondary | ICD-10-CM

## 2023-08-20 DIAGNOSIS — E782 Mixed hyperlipidemia: Secondary | ICD-10-CM | POA: Diagnosis not present

## 2023-08-20 DIAGNOSIS — I10 Essential (primary) hypertension: Secondary | ICD-10-CM | POA: Diagnosis not present

## 2023-08-20 DIAGNOSIS — I2510 Atherosclerotic heart disease of native coronary artery without angina pectoris: Secondary | ICD-10-CM | POA: Diagnosis not present

## 2023-08-20 NOTE — Progress Notes (Signed)
 Cardiology Office Note:    Date:  08/20/2023   ID:  Jorge Molina., DOB Jun 16, 1961, MRN [id number]  PCP:  Patient, No Pcp Per  Cardiologist:  Jennifer JONELLE Crape, MD   Referring MD: No ref. provider found    ASSESSMENT:    1. Pure hypercholesterolemia   2. Mixed hyperlipidemia    PLAN:    In order of problems listed above:  Coronary artery disease: Secondary prevention stressed with the patient.  Importance of compliance with diet medication stressed and she vocalized understanding.  He was advised to walk at least half an hour a day 5 days a week and he promises to do so. Essential hypertension: Blood pressure stable and diet was emphasized.  Lifestyle modification urged. Mixed dyslipidemia: On lipid-lowering medications followed by primary care.  He is fasting and will have blood work. Obesity: He has gained significant weight in the past several weeks.  He attributes it to diet and sedentary lifestyle.  I cautioned him against this.  He promises to do better.  Lifestyle modification and risks of obesity explained and he promises to do better. Patient will be seen in follow-up appointment in 12 months or earlier if the patient has any concerns.    Medication Adjustments/Labs and Tests Ordered: Current medicines are reviewed at length with the patient today.  Concerns regarding medicines are outlined above.  Orders Placed This Encounter  Procedures   EKG 12-Lead   No orders of the defined types were placed in this encounter.    No chief complaint on file.    History of Present Illness:    Jorge Mcinturff. is a 63 y.o. male.  Patient has past medical history of coronary artery disease post CABG surgery, essential hypertension, mixed dyslipidemia and obesity.  He leads a sedentary lifestyle.  He is telling me that he has taken a new job which entails travel and he has been lax with his food habits.  No chest pain orthopnea or PND.  At the time of my evaluation, the patient is  alert awake oriented and in no distress.  Past Medical History:  Diagnosis Date   Benign prostatic hyperplasia 03/16/2012   IMO SNOMED Dx Update Oct 2024     BPH (benign prostatic hypertrophy) 03/16/2012   CAD (coronary artery disease) 11/26/2020   Central retinal vein occlusion of right eye 03/18/2013   [place]   Erectile dysfunction 09/21/2013   Essential hypertension, benign 08/04/2013   Fatigue 03/16/2012   Hyperlipidemia    Hypertension    Obesity (BMI 35.0-39.9 without comorbidity) 03/16/2012   Pure hypercholesterolemia 08/04/2013   S/P CABG x 3 11/09/2020   LIMA to LAD, SVG to OM, SVG to PDA, [facility] via right thigh   STEMI involving right coronary artery ([facility]) 11/07/2020    Past Surgical History:  Procedure Laterality Date   CORONARY ARTERY BYPASS GRAFT N/A 11/09/2020   Procedure: CORONARY ARTERY BYPASS GRAFTING (CABG) X THREE , USING LEFT INTERNAL MAMMARY ARTERY AND RIGHT LEG GREATER SAPHENOUS VEIN HARVESTED ENDOSCOPICALLY;  Surgeon: Dusty Sudie DEL, MD;  Location: [facility];  Service: Open Heart Surgery;  Laterality: N/A;   CORONARY/GRAFT ACUTE MI REVASCULARIZATION N/A 11/07/2020   Procedure: Coronary/Graft Acute MI Revascularization;  Surgeon: Wonda Sharper, MD;  Location: [facility] INVASIVE CV LAB;  Service: Cardiovascular;  Laterality: N/A;   LEFT HEART CATH AND CORONARY ANGIOGRAPHY N/A 11/07/2020   Procedure: LEFT HEART CATH AND CORONARY ANGIOGRAPHY;  Surgeon: Wonda Sharper, MD;  Location: [facility] INVASIVE CV LAB;  Service:  Cardiovascular;  Laterality: N/A;   TEE WITHOUT CARDIOVERSION N/A 11/09/2020   Procedure: TRANSESOPHAGEAL ECHOCARDIOGRAM (TEE);  Surgeon: Dusty Sudie DEL, MD;  Location: [facility] OR;  Service: Open Heart Surgery;  Laterality: N/A;   TONSILLECTOMY      Current Medications: Current Meds  Medication Sig   aspirin  EC 81 MG tablet Take 81 mg by mouth daily. Swallow whole.   atorvastatin  (LIPITOR ) 80 MG tablet Take 1 tablet (80 mg total) by mouth daily.   ezetimibe   (ZETIA ) 10 MG tablet Take 1 tablet (10 mg total) by mouth daily.   Melatonin 10 MG TABS Take 10 mg by mouth at bedtime as needed for sleep (sleep).   metoprolol  tartrate (LOPRESSOR ) 50 MG tablet Take 1 tablet (50 mg total) by mouth 2 (two) times daily.   nitroGLYCERIN  (NITROSTAT ) 0.4 MG SL tablet Place 1 tablet (0.4 mg total) under the tongue every 5 (five) minutes as needed for chest pain.     Allergies:   Patient has no known allergies.   Social History   Socioeconomic History   Marital status: Single    Spouse name: Not on file   Number of children: Not on file   Years of education: Not on file   Highest education level: Not on file  Occupational History   Not on file  Tobacco Use   Smoking status: Former    Types: Cigars    Quit date: 08/18/2000    Years since quitting: 23.0   Smokeless tobacco: Never   Tobacco comments:    smoked on/off  Substance and Sexual Activity   Alcohol use: Yes    Alcohol/week: 10.0 standard drinks of alcohol    Types: 10 Glasses of wine per week    Comment: beer and wine   Drug use: No   Sexual activity: Yes  Other Topics Concern   Not on file  Social History Narrative   Marital status: single; dating.      Children: none      Employment:  Insurance Account Manager       Tobacco: none       Alcohol:  1-2 beers and wine per day.  No DWIs.      Drugs: none       Exercise: none      Seatbelt:  100%      Guns:  Loaded guns.        Sexual activity: sexually active; no STDs; last STD screening 07/2013.  Total sexual partners = 40s.    75% of time uses condoms.  Females.          Social Drivers of Corporate Investment Banker Strain: Not on file  Food Insecurity: Not on file  Transportation Needs: Not on file  Physical Activity: Not on file  Stress: Not on file  Social Connections: Not on file     Family History: The patient's family history includes Asthma in his sister; Cancer in his paternal grandfather and paternal  grandmother; Heart disease in his maternal grandmother; Heart disease (age of onset: 71) in his father; Hyperlipidemia in his father; Hypertension in his father, maternal grandmother, mother, and paternal uncle; Mental illness in his father; Stroke in his father, maternal grandmother, and paternal uncle.  ROS:   Please see the history of present illness.    All other systems reviewed and are negative.  EKGs/Labs/Other Studies Reviewed:    The following studies were reviewed today: .SABRAEKG Interpretation Date/Time:  Thursday August 20 2023 09:48:03 EST Ventricular Rate:  62 PR Interval:  190 QRS Duration:  82 QT Interval:  408 QTC Calculation: 414 R Axis:   81  Text Interpretation: Normal sinus rhythm Low voltage QRS Cannot rule out Anterior infarct (cited on or before 08-Nov-2020) Abnormal ECG When compared with ECG of 10-Nov-2020 07:21, Vent. rate has decreased BY  49 BPM Criteria for Inferior infarct are no longer Present Confirmed by Edwyna Backers [id number]) on 08/20/2023 9:15:08 AM     Recent Labs: 10/03/2022: BUN 12; Creatinine, Ser 1.07; Hemoglobin 13.8; Platelets 265; Potassium 4.7; Sodium 140; TSH 3.190 10/31/2022: ALT 28  Recent Lipid Panel    Component Value Date/Time   CHOL 127 10/03/2022 0849   TRIG 94 10/03/2022 0849   HDL 47 10/03/2022 0849   CHOLHDL 2.7 10/03/2022 0849   CHOLHDL 5.7 11/08/2020 0139   VLDL 25 11/08/2020 0139   LDLCALC 62 10/03/2022 0849    Physical Exam:    VS:  BP 132/88   Pulse 62   Ht 5' 7 (1.702 m)   Wt 242 lb (109.8 kg)   SpO2 96%   BMI 37.90 kg/m     Wt Readings from Last 3 Encounters:  08/20/23 242 lb (109.8 kg)  10/03/22 232 lb 6.4 oz (105.4 kg)  12/27/21 218 lb 12.8 oz (99.2 kg)     GEN: Patient is in no acute distress HEENT: Normal NECK: No JVD; No carotid bruits LYMPHATICS: No lymphadenopathy CARDIAC: Hear sounds regular, 2/6 systolic murmur at the apex. RESPIRATORY:  Clear to auscultation without rales, wheezing or  rhonchi  ABDOMEN: Soft, non-tender, non-distended MUSCULOSKELETAL:  No edema; No deformity  SKIN: Warm and dry NEUROLOGIC:  Alert and oriented x 3 PSYCHIATRIC:  Normal affect   Signed, Backers JONELLE Edwyna, MD  08/20/2023 9:14 AM     [facility]

## 2023-08-20 NOTE — Patient Instructions (Signed)
 Medication Instructions:  Your physician recommends that you continue on your current medications as directed. Please refer to the Current Medication list given to you today.  *If you need a refill on your cardiac medications before your next appointment, please call your pharmacy*   Lab Work: Your physician recommends that you have a CMP, CBC, TSH and lipids today in the office.  If you have labs (blood work) drawn today and your tests are completely normal, you will receive your results only by: MyChart Message (if you have MyChart) OR A paper copy in the mail If you have any lab test that is abnormal or we need to change your treatment, we will call you to review the results.   Testing/Procedures: Your physician has requested that you have an echocardiogram. Echocardiography is a painless test that uses sound waves to create images of your heart. It provides your doctor with information about the size and shape of your heart and how well your heart's chambers and valves are working. This procedure takes approximately one hour. There are no restrictions for this procedure. Please do NOT wear cologne, perfume, aftershave, or lotions (deodorant is allowed). Please arrive 15 minutes prior to your appointment time.     Follow-Up: At [facility], you and your health needs are our priority.  As part of our continuing mission to provide you with exceptional heart care, we have created designated Provider Care Teams.  These Care Teams include your primary Cardiologist (physician) and Advanced Practice Providers (APPs -  Physician Assistants and Nurse Practitioners) who all work together to provide you with the care you need, when you need it.  We recommend signing up for the patient portal called MyChart.  Sign up information is provided on this After Visit Summary.  MyChart is used to connect with patients for Virtual Visits (Telemedicine).  Patients are able to view lab/test results,  encounter notes, upcoming appointments, etc.  Non-urgent messages can be sent to your provider as well.   To learn more about what you can do with MyChart, go to [email or web address].    Your next appointment:   12 month(s)  The format for your next appointment:   In Person  Provider:   Jennifer Crape, MD   Other Instructions Echocardiogram An echocardiogram is a test that uses sound waves (ultrasound) to produce images of the heart. Images from an echocardiogram can provide important information about: Heart size and shape. The size and thickness and movement of your heart's walls. Heart muscle function and strength. Heart valve function or if you have stenosis. Stenosis is when the heart valves are too narrow. If blood is flowing backward through the heart valves (regurgitation). A tumor or infectious growth around the heart valves. Areas of heart muscle that are not working well because of poor blood flow or injury from a heart attack. Aneurysm detection. An aneurysm is a weak or damaged part of an artery wall. The wall bulges out from the normal force of blood pumping through the body. Tell a health care provider about: Any allergies you have. All medicines you are taking, including vitamins, herbs, eye drops, creams, and over-the-counter medicines. Any blood disorders you have. Any surgeries you have had. Any medical conditions you have. Whether you are pregnant or may be pregnant. What are the risks? Generally, this is a safe test. However, problems may occur, including an allergic reaction to dye (contrast) that may be used during the test. What happens before the test? No specific preparation  is needed. You may eat and drink normally. What happens during the test? You will take off your clothes from the waist up and put on a hospital gown. Electrodes or electrocardiogram (ECG)patches may be placed on your chest. The electrodes or patches are then connected to a device  that monitors your heart rate and rhythm. You will lie down on a table for an ultrasound exam. A gel will be applied to your chest to help sound waves pass through your skin. A handheld device, called a transducer, will be pressed against your chest and moved over your heart. The transducer produces sound waves that travel to your heart and bounce back (or echo back) to the transducer. These sound waves will be captured in real-time and changed into images of your heart that can be viewed on a video monitor. The images will be recorded on a computer and reviewed by your health care provider. You may be asked to change positions or hold your breath for a short time. This makes it easier to get different views or better views of your heart. In some cases, you may receive contrast through an IV in one of your veins. This can improve the quality of the pictures from your heart. The procedure may vary among health care providers and hospitals.   What can I expect after the test? You may return to your normal, everyday life, including diet, activities, and medicines, unless your health care provider tells you not to do that. Follow these instructions at home: It is up to you to get the results of your test. Ask your health care provider, or the department that is doing the test, when your results will be ready. Keep all follow-up visits. This is important. Summary An echocardiogram is a test that uses sound waves (ultrasound) to produce images of the heart. Images from an echocardiogram can provide important information about the size and shape of your heart, heart muscle function, heart valve function, and other possible heart problems. You do not need to do anything to prepare before this test. You may eat and drink normally. After the echocardiogram is completed, you may return to your normal, everyday life, unless your health care provider tells you not to do that. This information is not intended to  replace advice given to you by your health care provider. Make sure you discuss any questions you have with your health care provider. Document Revised: 03/27/2020 Document Reviewed: 03/27/2020 Elsevier Patient Education  2021 [employer]   Important Information About Sugar

## 2023-08-21 LAB — CBC
Hematocrit: 42.1 % (ref 37.5–51.0)
Hemoglobin: 14 g/dL (ref 13.0–17.7)
MCH: 30.3 pg (ref 26.6–33.0)
MCHC: 33.3 g/dL (ref 31.5–35.7)
MCV: 91 fL (ref 79–97)
Platelets: 263 10*3/uL (ref 150–450)
RBC: 4.62 x10E6/uL (ref 4.14–5.80)
RDW: 12.4 % (ref 11.6–15.4)
WBC: 8.3 10*3/uL (ref 3.4–10.8)

## 2023-08-21 LAB — LIPID PANEL
Chol/HDL Ratio: 3.6 {ratio} (ref 0.0–5.0)
Cholesterol, Total: 157 mg/dL (ref 100–199)
HDL: 44 mg/dL (ref >39)
LDL Chol Calc (NIH): 88 mg/dL (ref 0–99)
Triglycerides: 143 mg/dL (ref 0–149)
VLDL Cholesterol Cal: 25 mg/dL (ref 5–40)

## 2023-08-21 LAB — COMPREHENSIVE METABOLIC PANEL
ALT: 38 [IU]/L (ref 0–44)
AST: 21 [IU]/L (ref 0–40)
Albumin: 4.4 g/dL (ref 3.9–4.9)
Alkaline Phosphatase: 76 [IU]/L (ref 44–121)
BUN/Creatinine Ratio: 14 (ref 10–24)
BUN: 15 mg/dL (ref 8–27)
Bilirubin Total: 0.5 mg/dL (ref 0.0–1.2)
CO2: 22 mmol/L (ref 20–29)
Calcium: 9.7 mg/dL (ref 8.6–10.2)
Chloride: 103 mmol/L (ref 96–106)
Creatinine, Ser: 1.11 mg/dL (ref 0.76–1.27)
Globulin, Total: 2.4 g/dL (ref 1.5–4.5)
Glucose: 104 mg/dL — ABNORMAL HIGH (ref 70–99)
Potassium: 4.9 mmol/L (ref 3.5–5.2)
Sodium: 141 mmol/L (ref 134–144)
Total Protein: 6.8 g/dL (ref 6.0–8.5)
eGFR: 76 mL/min/{1.73_m2} (ref >59)

## 2023-08-21 LAB — TSH: TSH: 3.38 u[IU]/mL (ref 0.450–4.500)

## 2023-08-22 ENCOUNTER — Other Ambulatory Visit: Payer: Self-pay | Admitting: Cardiology

## 2023-08-25 ENCOUNTER — Encounter (INDEPENDENT_AMBULATORY_CARE_PROVIDER_SITE_OTHER): Payer: BC Managed Care – PPO | Admitting: Ophthalmology

## 2023-08-25 DIAGNOSIS — H35033 Hypertensive retinopathy, bilateral: Secondary | ICD-10-CM

## 2023-08-25 DIAGNOSIS — H43813 Vitreous degeneration, bilateral: Secondary | ICD-10-CM | POA: Diagnosis not present

## 2023-08-25 DIAGNOSIS — H34811 Central retinal vein occlusion, right eye, with macular edema: Secondary | ICD-10-CM | POA: Diagnosis not present

## 2023-08-25 DIAGNOSIS — I10 Essential (primary) hypertension: Secondary | ICD-10-CM | POA: Diagnosis not present

## 2023-08-27 ENCOUNTER — Ambulatory Visit: Payer: BC Managed Care – PPO | Attending: Cardiology

## 2023-08-27 DIAGNOSIS — I2510 Atherosclerotic heart disease of native coronary artery without angina pectoris: Secondary | ICD-10-CM | POA: Diagnosis not present

## 2023-08-27 LAB — ECHOCARDIOGRAM COMPLETE
P 1/2 time: 528 ms
S' Lateral: 3.8 cm

## 2023-09-04 ENCOUNTER — Encounter (INDEPENDENT_AMBULATORY_CARE_PROVIDER_SITE_OTHER): Payer: 59 | Admitting: Ophthalmology

## 2023-10-13 ENCOUNTER — Other Ambulatory Visit: Payer: Self-pay | Admitting: Cardiology

## 2023-10-13 ENCOUNTER — Telehealth: Payer: Self-pay | Admitting: Cardiology

## 2023-10-13 MED ORDER — ATORVASTATIN CALCIUM 80 MG PO TABS: 80.0000 mg | ORAL_TABLET | Freq: Every day | ORAL | 11 refills | Status: AC

## 2023-10-13 MED ORDER — METOPROLOL TARTRATE 50 MG PO TABS: 50.0000 mg | ORAL_TABLET | Freq: Two times a day (BID) | ORAL | 11 refills | Status: AC

## 2023-10-13 NOTE — Telephone Encounter (Signed)
*  STAT* If patient is at the pharmacy, call can be transferred to refill team.   1. Which medications need to be refilled? (please list name of each medication and dose if known) atorvastatin (LIPITOR) 80 MG tablet  metoprolol tartrate (LOPRESSOR) 50 MG tablet   2. Which pharmacy/location (including street and city if local pharmacy) is medication to be sent to? [id number] - [place]ita Levan, [place] - [address] AT [facility] [phone]   3. Do they need a 30 day or 90 day supply? 90

## 2023-10-13 NOTE — Telephone Encounter (Signed)
 RX sent

## 2023-10-13 NOTE — Telephone Encounter (Signed)
 Rx refill sent to pharmacy.

## 2023-11-27 ENCOUNTER — Encounter (INDEPENDENT_AMBULATORY_CARE_PROVIDER_SITE_OTHER): Payer: BC Managed Care – PPO | Admitting: Ophthalmology

## 2023-11-27 DIAGNOSIS — H34811 Central retinal vein occlusion, right eye, with macular edema: Secondary | ICD-10-CM | POA: Diagnosis not present

## 2023-11-27 DIAGNOSIS — H43813 Vitreous degeneration, bilateral: Secondary | ICD-10-CM

## 2023-11-27 DIAGNOSIS — I10 Essential (primary) hypertension: Secondary | ICD-10-CM | POA: Diagnosis not present

## 2023-11-27 DIAGNOSIS — H35033 Hypertensive retinopathy, bilateral: Secondary | ICD-10-CM

## 2023-11-27 DIAGNOSIS — H2513 Age-related nuclear cataract, bilateral: Secondary | ICD-10-CM

## 2024-03-04 ENCOUNTER — Encounter (INDEPENDENT_AMBULATORY_CARE_PROVIDER_SITE_OTHER): Admitting: Ophthalmology

## 2024-03-04 DIAGNOSIS — H35033 Hypertensive retinopathy, bilateral: Secondary | ICD-10-CM | POA: Diagnosis not present

## 2024-03-04 DIAGNOSIS — H2513 Age-related nuclear cataract, bilateral: Secondary | ICD-10-CM

## 2024-03-04 DIAGNOSIS — I10 Essential (primary) hypertension: Secondary | ICD-10-CM

## 2024-03-04 DIAGNOSIS — H43813 Vitreous degeneration, bilateral: Secondary | ICD-10-CM

## 2024-03-04 DIAGNOSIS — H34811 Central retinal vein occlusion, right eye, with macular edema: Secondary | ICD-10-CM | POA: Diagnosis not present

## 2024-06-10 ENCOUNTER — Encounter (INDEPENDENT_AMBULATORY_CARE_PROVIDER_SITE_OTHER): Admitting: Ophthalmology

## 2024-06-13 ENCOUNTER — Encounter (INDEPENDENT_AMBULATORY_CARE_PROVIDER_SITE_OTHER): Admitting: Ophthalmology

## 2024-06-16 ENCOUNTER — Encounter (INDEPENDENT_AMBULATORY_CARE_PROVIDER_SITE_OTHER): Admitting: Ophthalmology

## 2024-06-16 DIAGNOSIS — H43813 Vitreous degeneration, bilateral: Secondary | ICD-10-CM | POA: Diagnosis not present

## 2024-06-16 DIAGNOSIS — H35033 Hypertensive retinopathy, bilateral: Secondary | ICD-10-CM | POA: Diagnosis not present

## 2024-06-16 DIAGNOSIS — I10 Essential (primary) hypertension: Secondary | ICD-10-CM | POA: Diagnosis not present

## 2024-06-16 DIAGNOSIS — H34811 Central retinal vein occlusion, right eye, with macular edema: Secondary | ICD-10-CM | POA: Diagnosis not present

## 2024-08-27 ENCOUNTER — Other Ambulatory Visit: Payer: Self-pay | Admitting: Cardiology

## 2024-09-22 ENCOUNTER — Encounter (INDEPENDENT_AMBULATORY_CARE_PROVIDER_SITE_OTHER): Admitting: Ophthalmology

## 2024-09-23 ENCOUNTER — Encounter (INDEPENDENT_AMBULATORY_CARE_PROVIDER_SITE_OTHER): Admitting: Ophthalmology

## 2025-01-12 ENCOUNTER — Encounter (INDEPENDENT_AMBULATORY_CARE_PROVIDER_SITE_OTHER): Admitting: Ophthalmology
